# Patient Record
Sex: Female | Born: 1945 | Race: White | Hispanic: No | Marital: Married | Smoking: Never smoker
Health system: Southern US, Community
[De-identification: ages and names within clinical notes are randomized; demographics above are authoritative.]

## PROBLEM LIST (undated history)

## (undated) DIAGNOSIS — I471 Supraventricular tachycardia, unspecified: Secondary | ICD-10-CM

## (undated) DIAGNOSIS — Z789 Other specified health status: Secondary | ICD-10-CM

## (undated) DIAGNOSIS — E785 Hyperlipidemia, unspecified: Secondary | ICD-10-CM

## (undated) HISTORY — PX: COLONOSCOPY: SHX174

## (undated) HISTORY — DX: Supraventricular tachycardia, unspecified: I47.10

## (undated) HISTORY — DX: Hyperlipidemia, unspecified: E78.5

## (undated) HISTORY — DX: Supraventricular tachycardia: I47.1

---

## 1996-07-10 HISTORY — PX: BREAST SURGERY: SHX581

## 1998-06-08 ENCOUNTER — Other Ambulatory Visit: Admission: RE | Admit: 1998-06-08 | Discharge: 1998-06-08 | Payer: Self-pay | Admitting: Family Medicine

## 1999-06-10 HISTORY — PX: ANKLE SURGERY: SHX546

## 1999-06-10 HISTORY — PX: BONY PELVIS SURGERY: SHX572

## 1999-09-02 ENCOUNTER — Encounter: Payer: Self-pay | Admitting: Emergency Medicine

## 1999-09-02 ENCOUNTER — Emergency Department (HOSPITAL_COMMUNITY): Admission: EM | Admit: 1999-09-02 | Discharge: 1999-09-02 | Payer: Self-pay

## 1999-10-20 ENCOUNTER — Emergency Department (HOSPITAL_COMMUNITY): Admission: EM | Admit: 1999-10-20 | Discharge: 1999-10-20 | Payer: Self-pay | Admitting: Emergency Medicine

## 2001-04-29 ENCOUNTER — Ambulatory Visit (HOSPITAL_COMMUNITY): Admission: RE | Admit: 2001-04-29 | Discharge: 2001-04-29 | Payer: Self-pay | Admitting: *Deleted

## 2003-07-04 ENCOUNTER — Other Ambulatory Visit: Admission: RE | Admit: 2003-07-04 | Discharge: 2003-07-04 | Payer: Self-pay | Admitting: *Deleted

## 2003-09-29 ENCOUNTER — Ambulatory Visit (HOSPITAL_COMMUNITY): Admission: RE | Admit: 2003-09-29 | Discharge: 2003-09-29 | Payer: Self-pay | Admitting: Gastroenterology

## 2010-09-23 ENCOUNTER — Other Ambulatory Visit: Payer: Self-pay | Admitting: Family Medicine

## 2010-09-23 DIAGNOSIS — Z803 Family history of malignant neoplasm of breast: Secondary | ICD-10-CM

## 2010-09-23 DIAGNOSIS — N6019 Diffuse cystic mastopathy of unspecified breast: Secondary | ICD-10-CM

## 2010-10-02 ENCOUNTER — Ambulatory Visit
Admission: RE | Admit: 2010-10-02 | Discharge: 2010-10-02 | Disposition: A | Discharge status: Home / Self Care | Payer: Medicare Other | Source: Ambulatory Visit | Attending: Family Medicine | Admitting: Family Medicine

## 2010-10-02 DIAGNOSIS — Z803 Family history of malignant neoplasm of breast: Secondary | ICD-10-CM

## 2010-10-02 DIAGNOSIS — N6019 Diffuse cystic mastopathy of unspecified breast: Secondary | ICD-10-CM

## 2010-10-02 MED ORDER — GADOBENATE DIMEGLUMINE 529 MG/ML IV SOLN
13.0000 mL | Freq: Once | INTRAVENOUS | Status: AC | PRN
  Administered 2010-10-02: 13 mL via INTRAVENOUS

## 2011-12-24 ENCOUNTER — Other Ambulatory Visit: Payer: Self-pay | Admitting: Radiology

## 2012-01-22 ENCOUNTER — Ambulatory Visit (INDEPENDENT_AMBULATORY_CARE_PROVIDER_SITE_OTHER): Payer: Medicare Other | Admitting: Surgery

## 2012-01-22 ENCOUNTER — Encounter (INDEPENDENT_AMBULATORY_CARE_PROVIDER_SITE_OTHER): Payer: Self-pay | Admitting: Surgery

## 2012-01-22 VITALS — BP 126/80 | HR 79 | Temp 99.0°F | Ht 67.5 in | Wt 135.6 lb

## 2012-01-22 DIAGNOSIS — D249 Benign neoplasm of unspecified breast: Secondary | ICD-10-CM

## 2012-01-22 NOTE — Progress Notes (Signed)
Patient ID: Sheryl Mejia, female   DOB: 07/07/1945, 66 y.o.   MRN: [id number]  Chief Complaint  Patient presents with  . Pre-op Exam    eval Rt br     HPI Sheryl COLLAR is a 66 y.o. female.  Patient sent at the request of Dr. Yolanda Bonine  do to right breast mammographic abnormality. This was biopsied and showed a papilloma. She denies a history of breast pain, breast mass or nipple discharge. She has remote history of multiple right breast biopsies for fibrocystic change. HPI  Past Medical History  Diagnosis Date  . Hyperlipidemia   . Osteoporosis     Past Surgical History  Procedure Date  . Breast surgery   . Ankle surgery   . Bony pelvis surgery     Family History  Problem Relation Age of Onset  . Cancer Mother     breast  . Cancer Father     colon  . Cancer Maternal Aunt     breast  . Cancer Paternal Uncle     esophageal  . Cancer Paternal Grandfather     stomach    Social History History  Substance Use Topics  . Smoking status: Never Smoker   . Smokeless tobacco: Not on file  . Alcohol Use: No    No Known Allergies  Current Outpatient Prescriptions  Medication Sig Dispense Refill  . Calcium Citrate-Vitamin D (CALCIUM CITRATE + D PO) Take by mouth.      . cholecalciferol (VITAMIN D) 1000 UNITS tablet Take 1,000 Units by mouth daily.      Marland Kitchen co-enzyme Q-10 30 MG capsule Take 100 mg by mouth 2 (two) times daily.      . diphenhydrAMINE (BENADRYL) 25 mg capsule Take 25 mg by mouth every 6 (six) hours as needed.      . fish oil-omega-3 fatty acids 1000 MG capsule Take 2 g by mouth 2 (two) times daily.      . Flaxseed, Linseed, (FLAXSEED OIL PO) Take by mouth.      . Glucosamine-Chondroit-Vit C-Mn (GLUCOSAMINE 1500 COMPLEX PO) Take by mouth.      . ibandronate (BONIVA) 150 MG tablet Take 150 mg by mouth every 30 (thirty) days. Take in the morning with a full glass of water, on an empty stomach, and do not take anything else by mouth or lie down for the next 30 min.       . simvastatin (ZOCOR) 5 MG tablet Take 5 mg by mouth at bedtime.      . vitamin E (VITAMIN E) 400 UNIT capsule Take 400 Units by mouth daily.        Review of Systems Review of Systems  Constitutional: Negative for fever, chills and unexpected weight change.  HENT: Negative for hearing loss, congestion, sore throat, trouble swallowing and voice change.   Eyes: Negative for visual disturbance.  Respiratory: Negative for cough and wheezing.   Cardiovascular: Negative for chest pain, palpitations and leg swelling.  Gastrointestinal: Negative for nausea, vomiting, abdominal pain, diarrhea, constipation, blood in stool, abdominal distention and anal bleeding.  Genitourinary: Negative for hematuria, vaginal bleeding and difficulty urinating.  Musculoskeletal: Negative for arthralgias.  Skin: Negative for rash and wound.  Neurological: Negative for seizures, syncope and headaches.  Hematological: Negative for adenopathy. Does not bruise/bleed easily.  Psychiatric/Behavioral: Negative for confusion.    Blood pressure 126/80, pulse 79, temperature 99 F (37.2 C), temperature source Temporal, height 5' 7.5" (1.715 m), weight 135 lb 9.6 oz (61.508  kg), SpO2 98.00%.  Physical Exam Physical Exam  Constitutional: She is oriented to person, place, and time. She appears well-developed and well-nourished.  HENT:  Head: Normocephalic and atraumatic.  Eyes: EOM are normal. Pupils are equal, round, and reactive to light.  Neck: Normal range of motion. Neck supple.  Cardiovascular: Normal rate and regular rhythm.   Pulmonary/Chest: Effort normal and breath sounds normal.    Musculoskeletal: Normal range of motion.  Neurological: She is alert and oriented to person, place, and time.  Skin: Skin is warm and dry.  Psychiatric: She has a normal mood and affect. Her behavior is normal. Judgment and thought content normal.    Data Reviewed Mammogram and pathology  Papilloma right breast upper outer  quadrant.  Assessment    Papilloma right breast    Plan    Recommend right breast needle localized lumpectomy for excision of papilloma given small potential malignant possibility.The procedure has been discussed with the patient. Alternatives to surgery have been discussed with the patient.  Risks of surgery include bleeding,  Infection,  Seroma formation, death,  and the need for further surgery.   The patient understands and wishes to proceed.       Sheryl Moudy A. 01/22/2012, 3:42 PM

## 2012-01-22 NOTE — Patient Instructions (Signed)
Lumpectomy, Breast Conserving Surgery A lumpectomy is breast surgery that removes only part of the breast. Another name used may be partial mastectomy. The amount removed varies. Make sure you understand how much of your breast will be removed. Reasons for a lumpectomy:  Any solid breast mass.   Grouped significant nodularity that may be confused with a solitary breast mass.  Lumpectomy is the most common form of breast cancer surgery today. The surgeon removes the portion of your breast which contains the tumor (cancer). This is the lump. Some normal tissue around the lump is also removed to be sure that all the tumor has been removed.  If cancer cells are found in the margins where the breast tissue was removed, your surgeon will do more surgery to remove the remaining cancer tissue. This is called re-excision surgery. Radiation and/or chemotherapy treatments are often given following a lumpectomy to kill any cancer cells that could possibly remain.  REASONS YOU MAY NOT BE ABLE TO HAVE BREAST CONSERVING SURGERY:  The tumor is located in more than one place.   Your breast is small and the tumor is large so the breast would be disfigured.   The entire tumor removal is not successful with a lumpectomy.   You cannot commit to a full course of chemotherapy, radiation therapy or are pregnant and cannot have radiation.   You have previously had radiation to the breast to treat cancer.  HOW A LUMPECTOMY IS PERFORMED If overnight nursing is not required following a biopsy, a lumpectomy can be performed as a same-day surgery. This can be done in a hospital, clinic, or surgical center. The anesthesia used will depend on your surgeon. They will discuss this with you. A general anesthetic keeps you sleeping through the procedure. LET YOUR CAREGIVERS KNOW ABOUT THE FOLLOWING:  Allergies   Medications taken including herbs, eye drops, over the counter medications, and creams.   Use of steroids (by  mouth or creams)   Previous problems with anesthetics or Novocaine.   Possibility of pregnancy, if this applies   History of blood clots (thrombophlebitis)   History of bleeding or blood problems.   Previous surgery   Other health problems  BEFORE THE PROCEDURE You should be present one hour prior to your procedure unless directed otherwise.  AFTER THE PROCEDURE  After surgery, you will be taken to the recovery area where a nurse will watch and check your progress. Once you're awake, stable, and taking fluids well, barring other problems you will be allowed to go home.   Ice packs applied to your operative site may help with discomfort and keep the swelling down.   A small rubber drain may be placed in the breast for a couple of days to prevent a hematoma from developing in the breast.   A pressure dressing may be applied for 24 to 48 hours to prevent bleeding.   Keep the wound dry.   You may resume a normal diet and activities as directed. Avoid strenuous activities affecting the arm on the side of the biopsy site such as tennis, swimming, heavy lifting (more than 10 pounds) or pulling.   Bruising in the breast is normal following this procedure.   Wearing a bra - even to bed - may be more comfortable and also help keep the dressing on.   Change dressings as directed.   Only take over-the-counter or prescription medicines for pain, discomfort, or fever as directed by your caregiver.  Call for your results as   instructed by your surgeon. Remember it is your responsibility to get the results of your lumpectomy if your surgeon asked you to follow-up. Do not assume everything is fine if you have not heard from your caregiver. SEEK MEDICAL CARE IF:   There is increased bleeding (more than a small spot) from the wound.   You notice redness, swelling, or increasing pain in the wound.   Pus is coming from wound.   An unexplained oral temperature above 102 F (38.9 C) develops.     You notice a foul smell coming from the wound or dressing.  SEEK IMMEDIATE MEDICAL CARE IF:   You develop a rash.   You have difficulty breathing.   You have any allergic problems.  Document Released: 07/07/2006 Document Revised: 05/15/2011 Document Reviewed: 10/08/2006 [employer] Patient Information 2012 [employer]. 

## 2012-02-17 ENCOUNTER — Encounter (HOSPITAL_BASED_OUTPATIENT_CLINIC_OR_DEPARTMENT_OTHER): Payer: Self-pay | Admitting: *Deleted

## 2012-02-17 NOTE — Progress Notes (Signed)
To come in for [facility] labs and cxr- Pt goes by Orlie Pollen-

## 2012-02-18 ENCOUNTER — Encounter (HOSPITAL_BASED_OUTPATIENT_CLINIC_OR_DEPARTMENT_OTHER)
Admission: RE | Admit: 2012-02-18 | Discharge: 2012-02-18 | Disposition: A | Discharge status: Home / Self Care | Payer: Medicare Other | Source: Ambulatory Visit | Attending: Surgery | Admitting: Surgery

## 2012-02-18 ENCOUNTER — Ambulatory Visit
Admission: RE | Admit: 2012-02-18 | Discharge: 2012-02-18 | Disposition: A | Discharge status: Home / Self Care | Payer: Medicare Other | Source: Ambulatory Visit | Attending: Surgery | Admitting: Surgery

## 2012-02-18 LAB — CBC WITH DIFFERENTIAL/PLATELET
Basophils Relative: 0 % (ref 0–1)
Eosinophils Absolute: 0 10*3/uL (ref 0.0–0.7)
Eosinophils Relative: 1 % (ref 0–5)
Hemoglobin: 12.9 g/dL (ref 12.0–15.0)
Lymphs Abs: 1.5 10*3/uL (ref 0.7–4.0)
MCH: 31.9 pg (ref 26.0–34.0)
MCHC: 35 g/dL (ref 30.0–36.0)
MCV: 91.1 fL (ref 78.0–100.0)
Monocytes Relative: 12 % (ref 3–12)
Neutrophils Relative %: 53 % (ref 43–77)
RBC: 4.05 MIL/uL (ref 3.87–5.11)

## 2012-02-18 LAB — COMPREHENSIVE METABOLIC PANEL
Albumin: 4.2 g/dL (ref 3.5–5.2)
Alkaline Phosphatase: 69 U/L (ref 39–117)
BUN: 20 mg/dL (ref 6–23)
Calcium: 10.2 mg/dL (ref 8.4–10.5)
Creatinine, Ser: 0.78 mg/dL (ref 0.50–1.10)
GFR calc Af Amer: >90 mL/min (ref >90)
Glucose, Bld: 89 mg/dL (ref 70–99)
Potassium: 4.5 mEq/L (ref 3.5–5.1)
Total Protein: 7.2 g/dL (ref 6.0–8.3)

## 2012-02-23 ENCOUNTER — Encounter (HOSPITAL_BASED_OUTPATIENT_CLINIC_OR_DEPARTMENT_OTHER): Payer: Self-pay | Admitting: Anesthesiology

## 2012-02-23 ENCOUNTER — Encounter (HOSPITAL_BASED_OUTPATIENT_CLINIC_OR_DEPARTMENT_OTHER)
Admission: RE | Disposition: A | Discharge status: Home / Self Care | Payer: Self-pay | Source: Ambulatory Visit | Attending: Surgery

## 2012-02-23 ENCOUNTER — Encounter (HOSPITAL_BASED_OUTPATIENT_CLINIC_OR_DEPARTMENT_OTHER): Payer: Self-pay | Admitting: Surgery

## 2012-02-23 ENCOUNTER — Ambulatory Visit (HOSPITAL_BASED_OUTPATIENT_CLINIC_OR_DEPARTMENT_OTHER)
Admission: RE | Admit: 2012-02-23 | Discharge: 2012-02-23 | Disposition: A | Discharge status: Home / Self Care | Payer: Medicare Other | Source: Ambulatory Visit | Attending: Surgery | Admitting: Surgery

## 2012-02-23 ENCOUNTER — Ambulatory Visit (HOSPITAL_BASED_OUTPATIENT_CLINIC_OR_DEPARTMENT_OTHER): Payer: Medicare Other | Admitting: Anesthesiology

## 2012-02-23 DIAGNOSIS — D249 Benign neoplasm of unspecified breast: Secondary | ICD-10-CM

## 2012-02-23 DIAGNOSIS — N6019 Diffuse cystic mastopathy of unspecified breast: Secondary | ICD-10-CM

## 2012-02-23 HISTORY — DX: Other specified health status: Z78.9

## 2012-02-23 SURGERY — BREAST LUMPECTOMY WITH NEEDLE LOCALIZATION
Anesthesia: General | Site: Breast | Laterality: Right | Wound class: Clean

## 2012-02-23 MED ORDER — DEXAMETHASONE SODIUM PHOSPHATE 4 MG/ML IJ SOLN
INTRAMUSCULAR | Status: DC | PRN
  Administered 2012-02-23: 10 mg via INTRAVENOUS

## 2012-02-23 MED ORDER — DEXTROSE 5 % IV SOLN
3.0000 g | INTRAVENOUS | Status: AC
  Administered 2012-02-23: 3 g via INTRAVENOUS

## 2012-02-23 MED ORDER — LACTATED RINGERS IV SOLN
INTRAVENOUS | Status: DC
  Administered 2012-02-23: 13:00:00 via INTRAVENOUS

## 2012-02-23 MED ORDER — PROPOFOL 10 MG/ML IV BOLUS
INTRAVENOUS | Status: DC | PRN
  Administered 2012-02-23: 200 mg via INTRAVENOUS

## 2012-02-23 MED ORDER — OXYCODONE-ACETAMINOPHEN 5-325 MG PO TABS: 1.0000 | ORAL_TABLET | ORAL | Status: DC | PRN

## 2012-02-23 MED ORDER — CHLORHEXIDINE GLUCONATE 4 % EX LIQD: 1.0000 "application " | Freq: Once | CUTANEOUS | Status: DC

## 2012-02-23 MED ORDER — MEPERIDINE HCL 25 MG/ML IJ SOLN: 6.2500 mg | INTRAMUSCULAR | Status: DC | PRN

## 2012-02-23 MED ORDER — MIDAZOLAM HCL 5 MG/5ML IJ SOLN
INTRAMUSCULAR | Status: DC | PRN
  Administered 2012-02-23: 2 mg via INTRAVENOUS

## 2012-02-23 MED ORDER — HYDROMORPHONE HCL PF 1 MG/ML IJ SOLN: 0.2500 mg | INTRAMUSCULAR | Status: DC | PRN

## 2012-02-23 MED ORDER — PROMETHAZINE HCL 25 MG/ML IJ SOLN: 6.2500 mg | INTRAMUSCULAR | Status: DC | PRN

## 2012-02-23 MED ORDER — ONDANSETRON HCL 4 MG/2ML IJ SOLN
INTRAMUSCULAR | Status: DC | PRN
  Administered 2012-02-23: 4 mg via INTRAVENOUS

## 2012-02-23 MED ORDER — BUPIVACAINE-EPINEPHRINE 0.25% -1:200000 IJ SOLN
INTRAMUSCULAR | Status: DC | PRN
  Administered 2012-02-23: 10 mL

## 2012-02-23 MED ORDER — FENTANYL CITRATE 0.05 MG/ML IJ SOLN
INTRAMUSCULAR | Status: DC | PRN
  Administered 2012-02-23 (×2): 50 ug via INTRAVENOUS

## 2012-02-23 MED ORDER — OXYCODONE HCL 5 MG PO TABS: 5.0000 mg | ORAL_TABLET | Freq: Once | ORAL | Status: DC | PRN

## 2012-02-23 MED ORDER — LIDOCAINE HCL (CARDIAC) 20 MG/ML IV SOLN
INTRAVENOUS | Status: DC | PRN
  Administered 2012-02-23: 50 mg via INTRAVENOUS

## 2012-02-23 MED ORDER — OXYCODONE HCL 5 MG/5ML PO SOLN: 5.0000 mg | Freq: Once | ORAL | Status: DC | PRN

## 2012-02-23 SURGICAL SUPPLY — 42 items
ADH SKN CLS APL DERMABOND .7 (GAUZE/BANDAGES/DRESSINGS) ×1
BLADE SURG 15 STRL LF DISP TIS (BLADE) ×1 IMPLANT
BLADE SURG 15 STRL SS (BLADE) ×2
CANISTER SUCTION 1200CC (MISCELLANEOUS) ×2 IMPLANT
CHLORAPREP W/TINT 26ML (MISCELLANEOUS) ×2 IMPLANT
CLIP TI WIDE RED SMALL 6 (CLIP) IMPLANT
CLOTH BEACON ORANGE TIMEOUT ST (SAFETY) ×2 IMPLANT
COVER MAYO STAND STRL (DRAPES) ×2 IMPLANT
COVER TABLE BACK 60X90 (DRAPES) ×2 IMPLANT
DECANTER SPIKE VIAL GLASS SM (MISCELLANEOUS) ×1 IMPLANT
DERMABOND ADVANCED (GAUZE/BANDAGES/DRESSINGS) ×1
DERMABOND ADVANCED .7 DNX12 (GAUZE/BANDAGES/DRESSINGS) ×1 IMPLANT
DEVICE DUBIN W/COMP PLATE 8390 (MISCELLANEOUS) ×1 IMPLANT
DRAPE LAPAROSCOPIC ABDOMINAL (DRAPES) IMPLANT
DRAPE PED LAPAROTOMY (DRAPES) ×2 IMPLANT
DRAPE UTILITY XL STRL (DRAPES) ×2 IMPLANT
ELECT COATED BLADE 2.86 ST (ELECTRODE) ×2 IMPLANT
ELECT REM PT RETURN 9FT ADLT (ELECTROSURGICAL) ×2
ELECTRODE REM PT RTRN 9FT ADLT (ELECTROSURGICAL) ×1 IMPLANT
GLOVE BIOGEL PI IND STRL 8 (GLOVE) ×1 IMPLANT
GLOVE BIOGEL PI INDICATOR 8 (GLOVE) ×1
GLOVE ECLIPSE 6.5 STRL STRAW (GLOVE) ×1 IMPLANT
GLOVE ECLIPSE 8.0 STRL XLNG CF (GLOVE) ×2 IMPLANT
GOWN PREVENTION PLUS XLARGE (GOWN DISPOSABLE) ×3 IMPLANT
KIT MARKER MARGIN INK (KITS) IMPLANT
NDL HYPO 25X1 1.5 SAFETY (NEEDLE) ×1 IMPLANT
NEEDLE HYPO 25X1 1.5 SAFETY (NEEDLE) ×2 IMPLANT
NS IRRIG 1000ML POUR BTL (IV SOLUTION) ×2 IMPLANT
PACK BASIN DAY SURGERY FS (CUSTOM PROCEDURE TRAY) ×2 IMPLANT
PENCIL BUTTON HOLSTER BLD 10FT (ELECTRODE) ×2 IMPLANT
SLEEVE SCD COMPRESS KNEE MED (MISCELLANEOUS) ×2 IMPLANT
SPONGE LAP 4X18 X RAY DECT (DISPOSABLE) ×2 IMPLANT
SUT MON AB 4-0 PC3 18 (SUTURE) ×2 IMPLANT
SUT SILK 2 0 SH (SUTURE) IMPLANT
SUT VIC AB 3-0 SH 27 (SUTURE) ×2
SUT VIC AB 3-0 SH 27X BRD (SUTURE) ×1 IMPLANT
SYR CONTROL 10ML LL (SYRINGE) ×2 IMPLANT
TOWEL OR 17X24 6PK STRL BLUE (TOWEL DISPOSABLE) ×3 IMPLANT
TOWEL OR NON WOVEN STRL DISP B (DISPOSABLE) ×2 IMPLANT
TUBE CONNECTING 20X1/4 (TUBING) ×2 IMPLANT
WATER STERILE IRR 1000ML POUR (IV SOLUTION) IMPLANT
YANKAUER SUCT BULB TIP NO VENT (SUCTIONS) ×2 IMPLANT

## 2012-02-23 NOTE — Transfer of Care (Signed)
Immediate Anesthesia Transfer of Care Note  Patient: Sheryl Mejia  Procedure(s) Performed: Procedure(s) (LRB) with comments: BREAST LUMPECTOMY WITH NEEDLE LOCALIZATION (Right) - right breast needle localized lumpectomy  Patient Location: PACU  Anesthesia Type: General  Level of Consciousness: awake, alert  and oriented  Airway & Oxygen Therapy: Patient Spontanous Breathing and Patient connected to face mask oxygen  Post-op Assessment: Report given to PACU RN and Post -op Vital signs reviewed and stable  Post vital signs: Reviewed and stable  Complications: No apparent anesthesia complications

## 2012-02-23 NOTE — Anesthesia Postprocedure Evaluation (Signed)
  Anesthesia Post-op Note  Patient: Sheryl Mejia  Procedure(s) Performed: Procedure(s) (LRB) with comments: BREAST LUMPECTOMY WITH NEEDLE LOCALIZATION (Right) - right breast needle localized lumpectomy  Patient Location: PACU  Anesthesia Type: General  Level of Consciousness: awake  Airway and Oxygen Therapy: Patient Spontanous Breathing  Post-op Pain: mild  Post-op Assessment: Post-op Vital signs reviewed  Post-op Vital Signs: stable  Complications: No apparent anesthesia complications

## 2012-02-23 NOTE — Anesthesia Preprocedure Evaluation (Signed)
Anesthesia Evaluation  Patient identified by MRN, date of birth, ID band Patient awake    Reviewed: Allergy & Precautions, H&P , NPO status , Patient's Chart, lab work & pertinent test results  History of Anesthesia Complications Negative for: history of anesthetic complications  Airway Mallampati: I  Neck ROM: full    Dental No notable dental hx. (+) Teeth Intact   Pulmonary neg pulmonary ROS,  breath sounds clear to auscultation  Pulmonary exam normal       Cardiovascular negative cardio ROS  IRhythm:regular Rate:Normal     Neuro/Psych negative neurological ROS  negative psych ROS   GI/Hepatic negative GI ROS, Neg liver ROS,   Endo/Other  negative endocrine ROS  Renal/GU negative Renal ROS  negative genitourinary   Musculoskeletal   Abdominal   Peds  Hematology negative hematology ROS (+)   Anesthesia Other Findings   Reproductive/Obstetrics negative OB ROS                           Anesthesia Physical Anesthesia Plan  ASA: I  Anesthesia Plan: General and General LMA   Post-op Pain Management:    Induction:   Airway Management Planned:   Additional Equipment:   Intra-op Plan:   Post-operative Plan:   Informed Consent: I have reviewed the patients History and Physical, chart, labs and discussed the procedure including the risks, benefits and alternatives for the proposed anesthesia with the patient or authorized representative who has indicated his/her understanding and acceptance.     Plan Discussed with: CRNA and Surgeon  Anesthesia Plan Comments:         Anesthesia Quick Evaluation  

## 2012-02-23 NOTE — Op Note (Signed)
Right Breast Partial Mastectomy with needle localization  Indications: This patient presents with history of a right breast mass. Given the clinical history and physical exam, along with indicated diagnostic studies, breast biopsy will be performed.Core biopsy showed papilloma.The procedure has been discussed with the patient. Alternatives to surgery have been discussed with the patient.  Risks of surgery include bleeding,  Infection,  Seroma formation, death,  and the need for further surgery.   The patient understands and wishes to proceed.  Pre-operative Diagnosis: right breast mass/papilloma  Post-operative Diagnosis: right breast mass/papilloma  Surgeon: Harriette Bouillon A.   Assistants: OR staff  Anesthesia: General LMA anesthesia and Local anesthesia 0.25.% bupivacaine, with epinephrine  ASA Class: 2  Procedure Details  The patient was seen in the Holding Room. The risks, benefits, complications, treatment options, and expected outcomes were discussed with the patient. The possibilities of reaction to medication, pulmonary aspiration, bleeding, infection, the need for additional procedures, failure to diagnose a condition, and creating a complication requiring transfusion or operation were discussed with the patient. The patient concurred with the proposed plan, giving informed consent. The site of surgery properly noted/marked. The patient was taken to Operating Room, identified as Sheryl Mejia, and the procedure verified as lumpectomy. A Time Out was held and the above information confirmed.  After induction of anesthesia, the right breast and chest were prepped and draped in standard fashion. The lumpectomy was performed by creating an oblique incision over the axillary tail of the breast adjacent to wire.  All tissue was excised around  The mass was excised. Hemostasis was achieved with cautery. The wound was irrigated and closed with a 3-0 Vicryl and 4-O monocryl.  subcuticular closure  in layers.  Specimen oriented with suture.    Sterile dressings were applied. At the end of the operation, all sponge, instrument, and needle counts were correct.  Findings: grossly clear surgical margins  Estimated Blood Loss:  Minimal         Drains: none         Total IV Fluids: 1200 mL         Specimens: breast mass  right           Complications:  None; patient tolerated the procedure well.         Disposition: PACU - hemodynamically stable.         Condition: Stable

## 2012-02-23 NOTE — H&P (Signed)
Sheryl Mejia   MRN: [id number]   Description: 66 year old female  Provider: Dortha Schwalbe., MD  Department: [facility]        Diagnoses     Intraductal papilloma of breast   - Primary    217    right      Reason for Visit     Pre-op Exam    eval Rt br         Vitals - Last Recorded       BP Pulse Temp Ht Wt BMI    126/80 79 99 F (37.2 C) (Temporal) 5' 7.5" (1.715 m) 135 lb 9.6 oz (61.508 kg) 20.92 kg/m2         SpO2              98%                 Progress Notes   Patient ID: Sheryl Mejia, female   DOB: Jan 10, 1946, 66 y.o.   MRN: [id number]    Chief Complaint   Patient presents with   .  Pre-op Exam       eval Rt br       HPI Sheryl Mejia is a 67 y.o. female.  Patient sent at the request of Dr. Yolanda Bonine  do to right breast mammographic abnormality. This was biopsied and showed a papilloma. She denies a history of breast pain, breast mass or nipple discharge. She has remote history of multiple right breast biopsies for fibrocystic change. HPI    Past Medical History   Diagnosis  Date   .  Hyperlipidemia     .  Osteoporosis         Past Surgical History   Procedure  Date   .  Breast surgery     .  Ankle surgery     .  Bony pelvis surgery         Family History   Problem  Relation  Age of Onset   .  Cancer  Mother         breast   .  Cancer  Father         colon   .  Cancer  Maternal Aunt         breast   .  Cancer  Paternal Uncle         esophageal   .  Cancer  Paternal Grandfather         stomach      Social History History   Substance Use Topics   .  Smoking status:  Never Smoker    .  Smokeless tobacco:  Not on file   .  Alcohol Use:  No      No Known Allergies    Current Outpatient Prescriptions   Medication  Sig  Dispense  Refill   .  Calcium Citrate-Vitamin D (CALCIUM CITRATE + D PO)  Take by mouth.         .  cholecalciferol (VITAMIN D) 1000 UNITS tablet  Take 1,000 Units by mouth daily.         Marland Kitchen  co-enzyme  Q-10 30 MG capsule  Take 100 mg by mouth 2 (two) times daily.         .  diphenhydrAMINE (BENADRYL) 25 mg capsule  Take 25 mg by mouth every 6 (six) hours as needed.         .  fish oil-omega-3 fatty acids 1000  MG capsule  Take 2 g by mouth 2 (two) times daily.         .  Flaxseed, Linseed, (FLAXSEED OIL PO)  Take by mouth.         .  Glucosamine-Chondroit-Vit C-Mn (GLUCOSAMINE 1500 COMPLEX PO)  Take by mouth.         .  ibandronate (BONIVA) 150 MG tablet  Take 150 mg by mouth every 30 (thirty) days. Take in the morning with a full glass of water, on an empty stomach, and do not take anything else by mouth or lie down for the next 30 min.         .  simvastatin (ZOCOR) 5 MG tablet  Take 5 mg by mouth at bedtime.         .  vitamin E (VITAMIN E) 400 UNIT capsule  Take 400 Units by mouth daily.            Review of Systems Review of Systems  Constitutional: Negative for fever, chills and unexpected weight change.  HENT: Negative for hearing loss, congestion, sore throat, trouble swallowing and voice change.   Eyes: Negative for visual disturbance.  Respiratory: Negative for cough and wheezing.   Cardiovascular: Negative for chest pain, palpitations and leg swelling.  Gastrointestinal: Negative for nausea, vomiting, abdominal pain, diarrhea, constipation, blood in stool, abdominal distention and anal bleeding.  Genitourinary: Negative for hematuria, vaginal bleeding and difficulty urinating.  Musculoskeletal: Negative for arthralgias.  Skin: Negative for rash and wound.  Neurological: Negative for seizures, syncope and headaches.  Hematological: Negative for adenopathy. Does not bruise/bleed easily.  Psychiatric/Behavioral: Negative for confusion.    Blood pressure 126/80, pulse 79, temperature 99 F (37.2 C), temperature source Temporal, height 5' 7.5" (1.715 m), weight 135 lb 9.6 oz (61.508 kg), SpO2 98.00%.   Physical Exam Physical Exam  Constitutional: She is oriented to person,  place, and time. She appears well-developed and well-nourished.  HENT:   Head: Normocephalic and atraumatic.  Eyes: EOM are normal. Pupils are equal, round, and reactive to light.  Neck: Normal range of motion. Neck supple.  Cardiovascular: Normal rate and regular rhythm.   Pulmonary/Chest: Effort normal and breath sounds normal.    Musculoskeletal: Normal range of motion.  Neurological: She is alert and oriented to person, place, and time.  Skin: Skin is warm and dry.  Psychiatric: She has a normal mood and affect. Her behavior is normal. Judgment and thought content normal.    Data Reviewed Mammogram and pathology  Papilloma right breast upper outer quadrant.   Assessment Papilloma right breast   Plan Recommend right breast needle localized lumpectomy for excision of papilloma given small potential malignant possibility.The procedure has been discussed with the patient. Alternatives to surgery have been discussed with the patient.  Risks of surgery include bleeding,  Infection,  Seroma formation, death,  and the need for further surgery.   The patient understands and wishes to proceed.       Jamelle Goldston A. 02/23/2012

## 2012-02-23 NOTE — Interval H&P Note (Signed)
History and Physical Interval Note:  02/23/2012 12:36 PM  Sheryl Mejia  has presented today for surgery, with the diagnosis of right breast papilloma  The various methods of treatment have been discussed with the patient and family. After consideration of risks, benefits and other options for treatment, the patient has consented to  Procedure(s) (LRB) with comments: BREAST LUMPECTOMY WITH NEEDLE LOCALIZATION (Right) - right breast needle localized lumpectomy as a surgical intervention .  The patient's history has been reviewed, patient examined, no change in status, stable for surgery.  I have reviewed the patient's chart and labs.  Questions were answered to the patient's satisfaction.     Rynell Ciotti A.

## 2012-03-01 ENCOUNTER — Telehealth (INDEPENDENT_AMBULATORY_CARE_PROVIDER_SITE_OTHER): Payer: Self-pay

## 2012-03-01 NOTE — Telephone Encounter (Signed)
I have called patient several times today and the phone has been busy all day, I tried calling cell and no answer and no voicemail available. Please give her path results if she calls back. Path was benign, papilloma.

## 2012-03-03 ENCOUNTER — Encounter (INDEPENDENT_AMBULATORY_CARE_PROVIDER_SITE_OTHER): Payer: Self-pay

## 2012-03-05 ENCOUNTER — Encounter (INDEPENDENT_AMBULATORY_CARE_PROVIDER_SITE_OTHER): Payer: Self-pay | Admitting: Surgery

## 2012-03-05 ENCOUNTER — Ambulatory Visit (INDEPENDENT_AMBULATORY_CARE_PROVIDER_SITE_OTHER): Payer: Medicare Other | Admitting: Surgery

## 2012-03-05 VITALS — BP 128/76 | HR 81 | Temp 98.6°F | Resp 18 | Ht 67.0 in | Wt 135.4 lb

## 2012-03-05 DIAGNOSIS — Z9889 Other specified postprocedural states: Secondary | ICD-10-CM

## 2012-03-05 NOTE — Progress Notes (Signed)
Patient returns after right breast needle localized lumpectomy. She is doing well.  Exam: Right breast incision clean dry and intact without signs of infection  Impression: Status post right breast needle was lumpectomy for fibrocystic change and papilloma SCLEROSING INTRADUCTAL PAPILLOMA WITH ADENOSIS  Plan: Return as needed. Resume regular activity. Resume screening mammogram next year

## 2012-03-05 NOTE — Patient Instructions (Signed)
Return as needed

## 2012-03-08 ENCOUNTER — Telehealth (INDEPENDENT_AMBULATORY_CARE_PROVIDER_SITE_OTHER): Payer: Self-pay

## 2012-03-08 NOTE — Telephone Encounter (Signed)
LM with pt's husband her appt was rescheduled to 04/02/12 at 9:45am.

## 2012-03-08 NOTE — Telephone Encounter (Signed)
Telephone message sent to incorrect patient.  Disregard.

## 2012-04-02 ENCOUNTER — Ambulatory Visit (INDEPENDENT_AMBULATORY_CARE_PROVIDER_SITE_OTHER): Payer: Medicare Other | Admitting: General Surgery

## 2020-11-27 ENCOUNTER — Ambulatory Visit: Payer: Medicare Other | Admitting: Cardiology

## 2020-11-27 ENCOUNTER — Encounter: Payer: Self-pay | Admitting: Cardiology

## 2020-11-27 ENCOUNTER — Other Ambulatory Visit: Payer: Self-pay

## 2020-11-27 VITALS — BP 170/96 | HR 96 | Temp 98.6°F | Resp 17 | Ht 67.0 in | Wt 132.6 lb

## 2020-11-27 DIAGNOSIS — R9431 Abnormal electrocardiogram [ECG] [EKG]: Secondary | ICD-10-CM

## 2020-11-27 DIAGNOSIS — R002 Palpitations: Secondary | ICD-10-CM

## 2020-11-27 NOTE — Progress Notes (Signed)
Date:  11/27/2020   ID:  [name], DOB [birth year], MRN [id number]  PCP:  Herbert Pun., MD (Inactive)  Cardiologist:  Rex Kras, DO, [facility] (established care 11/27/2020)  REASON FOR CONSULT: Palpitations  REQUESTING PHYSICIAN:  Joya Gaskins, [address] [place],  [place] [address]  Chief Complaint  Patient presents with   New Patient (Initial Visit)   Palpitations    Ref by Patrecia Pace    HPI  Sheryl Mejia is a 75 y.o. female who presents to the office with a chief complaint of " palpitations." Patient's past medical history and cardiovascular risk factors include: History of hyperlipidemia currently not on statin therapy, anxiety, postmenopausal female.  She is referred to the office at the request of Joya Gaskins, * for evaluation of palpitations.  Patient presents to the office for evaluation of palpitations.  She recently had her fourth COVID vaccination on Oct 08, 2020.  Thereafter she has been experiencing palpitations which she describes as her heart rate "beating harder."  No improving or worsening factors.  The symptoms last for seconds but no more than couple minutes, and usually self-limited.  Patient denies any near-syncope or syncopal events.  Since following up with her PCP patient states that her symptoms of palpitation are improving but not resolved.  Her blood pressure at today's office visit is elevated.  Patient states that this is most likely secondary to being nervous/anxiousness.  But her home blood pressures are usually between 120-130 mmHg.  I also reviewed her blood pressures at her PCPs office which were also elevated.  Patient denies any chest pain or shortness of breath at rest or with effort related activities.  FUNCTIONAL STATUS: No structured exercise program or daily routine.    ALLERGIES: No Known Allergies  MEDICATION LIST PRIOR TO VISIT: Current Meds  Medication Sig   Calcium Citrate-Vitamin D (CALCIUM  CITRATE + D PO) Take by mouth.   fish oil-omega-3 fatty acids 1000 MG capsule Take 2 g by mouth 2 (two) times daily.   Flaxseed, Linseed, (FLAXSEED OIL PO) Take by mouth.   Glucosamine-Chondroit-Vit C-Mn (GLUCOSAMINE 1500 COMPLEX PO) Take by mouth.   vitamin E 180 MG (400 UNITS) capsule Take 400 Units by mouth daily.     PAST MEDICAL HISTORY: Past Medical History:  Diagnosis Date   Hyperlipidemia    No pertinent past medical history    Osteoporosis     PAST SURGICAL HISTORY: Past Surgical History:  Procedure Laterality Date   ANKLE SURGERY  2001   lt ankle-orif   BONY PELVIS SURGERY  2001   lt hip orif   BREAST SURGERY  02,98   x2-rt br bx   COLONOSCOPY      FAMILY HISTORY: The patient family history includes Cancer in her father, maternal aunt, mother, paternal grandfather, and paternal uncle.  SOCIAL HISTORY:  The patient  reports that she has never smoked. She has never used smokeless tobacco. She reports that she does not drink alcohol and does not use drugs.  REVIEW OF SYSTEMS: Review of Systems  Constitutional: Negative for chills and fever.  HENT:  Negative for hoarse voice and nosebleeds.   Eyes:  Negative for discharge, double vision and pain.  Cardiovascular:  Positive for palpitations. Negative for chest pain, claudication, dyspnea on exertion, leg swelling, near-syncope, orthopnea, paroxysmal nocturnal dyspnea and syncope.  Respiratory:  Negative for hemoptysis and shortness of breath.   Musculoskeletal:  Negative for muscle cramps and myalgias.  Gastrointestinal:  Negative for abdominal pain, constipation, diarrhea, hematemesis, hematochezia, melena, nausea and vomiting.  Neurological:  Negative for dizziness and light-headedness.   PHYSICAL EXAM: Vitals with BMI 11/27/2020 11/27/2020 03/05/2012  Height - 5\' 7"  5\' 7"   Weight - 132 lbs 10 oz 135 lbs 6 oz  BMI - 10.27 25.3  Systolic 664 403 474  Diastolic 96 95 76  Pulse 96 92 81   CONSTITUTIONAL:  Well-developed and well-nourished. No acute distress.  SKIN: Skin is warm and dry. No rash noted. No cyanosis. No pallor. No jaundice HEAD: Normocephalic and atraumatic.  EYES: No scleral icterus MOUTH/THROAT: Moist oral membranes.  NECK: No JVD present. No thyromegaly noted. No carotid bruits  LYMPHATIC: No visible cervical adenopathy.  CHEST Normal respiratory effort. No intercostal retractions  LUNGS: Clear to auscultation bilaterally.  No stridor. No wheezes. No rales.  CARDIOVASCULAR: Tachycardia, regular, no murmurs rubs or gallops appreciated secondary to tachycardia. ABDOMINAL: Nonobese, soft, nontender, nondistended, positive bowel sounds in all 4 quadrants no apparent ascites.  EXTREMITIES: No peripheral edema.  Surgical scars over the left heel are well-healed. HEMATOLOGIC: No significant bruising NEUROLOGIC: Oriented to person, place, and time. Nonfocal. Normal muscle tone.  PSYCHIATRIC: Normal mood and affect. Normal behavior. Cooperative  CARDIAC DATABASE: EKG: 11/27/2020: Sinus  Tachycardia, 108bpm, consider old anteroseptal infarct, ST depression inferolateral, without underlying injury pattern.  Echocardiogram: No results found for this or any previous visit from the past 1095 days.    Stress Testing: No results found for this or any previous visit from the past 1095 days.   Heart Catheterization: None   LABORATORY DATA: CBC Latest Ref Rng & Units 02/23/2012 02/18/2012  WBC 4.0 - 10.5 K/uL - 4.5  Hemoglobin 12.0 - 15.0 g/dL 14.6 12.9  Hematocrit 36.0 - 46.0 % - 36.9  Platelets 150 - 400 K/uL - 217    CMP Latest Ref Rng & Units 02/18/2012  Glucose 70 - 99 mg/dL 89  BUN 6 - 23 mg/dL 20  Creatinine 0.50 - 1.10 mg/dL 0.78  Sodium 135 - 145 mEq/L 139  Potassium 3.5 - 5.1 mEq/L 4.5  Chloride 96 - 112 mEq/L 102  CO2 19 - 32 mEq/L 29  Calcium 8.4 - 10.5 mg/dL 10.2  Total Protein 6.0 - 8.3 g/dL 7.2  Total Bilirubin 0.3 - 1.2 mg/dL 0.5  Alkaline Phos 39 - 117 U/L  69  AST 0 - 37 U/L 25  ALT 0 - 35 U/L 23    Lipid Panel  No results found for: CHOL, TRIG, HDL, CHOLHDL, VLDL, LDLCALC, LDLDIRECT, LABVLDL  No components found for: NTPROBNP No results for input(s): PROBNP in the last 8760 hours. No results for input(s): TSH in the last 8760 hours.  BMP No results for input(s): NA, K, CL, CO2, GLUCOSE, BUN, CREATININE, CALCIUM, GFRNONAA, GFRAA in the last 8760 hours.  HEMOGLOBIN A1C No results found for: HGBA1C, MPG  External Labs: Collected: 10/31/2020. Hemoglobin 13.3 g/dL, hematocrit 38.6% TSH 2.75. Sodium 139, potassium 4.8, chloride 102, bicarb 31, BUN 17, creatinine 0.71  IMPRESSION:    ICD-10-CM   1. Palpitations  R00.2 EKG 12-Lead    LONG TERM MONITOR (3-14 DAYS)    2. Abnormal electrocardiogram  R94.31 PCV ECHOCARDIOGRAM COMPLETE    PCV MYOCARDIAL PERFUSION WO LEXISCAN       RECOMMENDATIONS: Sheryl Mejia is a 75 y.o. female whose past medical history and cardiac risk factors include: History of hyperlipidemia currently not on statin therapy, anxiety, postmenopausal female.  Palpitations: No known  reversible causes. Outside labs independently reviewed. TSH within normal limits. EKG shows sinus tachycardia with ST-T changes in the inferior and lateral leads which could be secondary to rate related changes but ischemia cannot be ruled out. Recommend 14-day extended Holter monitor to evaluate for dysrhythmias.  Abnormal EKG: Patient is noted to have ST-T changes in the inferolateral leads which may be secondary to elevated heart rate versus ischemia. She does not have any ischemic symptoms on presentation. She does have history of hyperlipidemia which was not treated. Echocardiogram will be ordered to evaluate for structural heart disease and left ventricular systolic function. Plan exercise nuclear stress test to evaluate for functional status and reversible ischemia I have asked her to follow-up with her PCP and have a fasting  lipid profile checked.  Patient's blood pressures are elevated at today's office visit I suspect that she may have a degree of benign essential hypertension.  But patient states that when she is at home her systolic blood pressures are usually between 120-130 mmHg.  I have asked her to keep a log of her blood pressures and to follow-up with either our office or PCP if her SBP is greater than or equal to 140 mmHg consistently.  Low-salt diet recommended.  FINAL MEDICATION LIST END OF ENCOUNTER: No orders of the defined types were placed in this encounter.   Medications Discontinued During This Encounter  Medication Reason   cholecalciferol (VITAMIN D) 1000 UNITS tablet Error   co-enzyme Q-10 30 MG capsule Error   ibandronate (BONIVA) 150 MG tablet Error   oxyCODONE-acetaminophen (ROXICET) 5-325 MG per tablet Error   simvastatin (ZOCOR) 5 MG tablet Error     Current Outpatient Medications:    Calcium Citrate-Vitamin D (CALCIUM CITRATE + D PO), Take by mouth., Disp: , Rfl:    fish oil-omega-3 fatty acids 1000 MG capsule, Take 2 g by mouth 2 (two) times daily., Disp: , Rfl:    Flaxseed, Linseed, (FLAXSEED OIL PO), Take by mouth., Disp: , Rfl:    Glucosamine-Chondroit-Vit C-Mn (GLUCOSAMINE 1500 COMPLEX PO), Take by mouth., Disp: , Rfl:    vitamin E 180 MG (400 UNITS) capsule, Take 400 Units by mouth daily., Disp: , Rfl:   Orders Placed This Encounter  Procedures   LONG TERM MONITOR (3-14 DAYS)   PCV MYOCARDIAL PERFUSION WO LEXISCAN   EKG 12-Lead   PCV ECHOCARDIOGRAM COMPLETE    There are no Patient Instructions on file for this visit.   --Continue cardiac medications as reconciled in final medication list. --Return in about 5 weeks (around 01/01/2021) for Follow up, Palpitations, Review test results. Or sooner if needed. --Continue follow-up with your primary care physician regarding the management of your other chronic comorbid conditions.  Patient's questions and concerns were  addressed to her satisfaction. She voices understanding of the instructions provided during this encounter.   This note was created using a voice recognition software as a result there may be grammatical errors inadvertently enclosed that do not reflect the nature of this encounter. Every attempt is made to correct such errors.  Total encounter time 45 minutes. *Total Encounter Time as defined by the Centers for Medicare and Medicaid Services includes, in addition to the face-to-face time of a patient visit (documented in the note above) non-face-to-face time: obtaining and reviewing outside history, ordering and reviewing medications, tests or procedures, care coordination (communications with other health care professionals or caregivers) and documentation in the medical record.   Rex Kras, [place], [facility]  Pager: [phone] Office:  [phone]    

## 2020-12-04 ENCOUNTER — Other Ambulatory Visit: Payer: Medicare Other

## 2020-12-17 ENCOUNTER — Ambulatory Visit: Payer: Medicare Other

## 2020-12-17 ENCOUNTER — Inpatient Hospital Stay: Payer: Medicare Other

## 2020-12-17 ENCOUNTER — Other Ambulatory Visit: Payer: Self-pay

## 2020-12-17 DIAGNOSIS — R9431 Abnormal electrocardiogram [ECG] [EKG]: Secondary | ICD-10-CM

## 2020-12-17 DIAGNOSIS — R002 Palpitations: Secondary | ICD-10-CM

## 2020-12-18 ENCOUNTER — Ambulatory Visit: Payer: Medicare Other

## 2020-12-18 ENCOUNTER — Inpatient Hospital Stay: Payer: Medicare Other

## 2020-12-18 DIAGNOSIS — R9431 Abnormal electrocardiogram [ECG] [EKG]: Secondary | ICD-10-CM

## 2021-01-01 ENCOUNTER — Telehealth: Payer: Self-pay

## 2021-01-01 ENCOUNTER — Ambulatory Visit: Payer: Medicare Other | Admitting: Cardiology

## 2021-01-01 NOTE — Telephone Encounter (Signed)
Please reschedule her appointment back to 2 weeks around January 15, 2021. As we will need turn around time to get the results back from [facility].

## 2021-01-02 NOTE — Telephone Encounter (Signed)
Spoke to patient and appt has been rescheduled

## 2021-01-04 NOTE — Progress Notes (Signed)
Called and spoke to pt regarding results.

## 2021-01-04 NOTE — Progress Notes (Signed)
Tried calling pt no answer wasn't able to leave vm

## 2021-01-04 NOTE — Progress Notes (Signed)
Called and spoke to pt regarding results. Pt voiced understanding.

## 2021-01-08 ENCOUNTER — Ambulatory Visit: Payer: Medicare Other | Admitting: Cardiology

## 2021-01-15 ENCOUNTER — Other Ambulatory Visit: Payer: Self-pay

## 2021-01-15 ENCOUNTER — Encounter: Payer: Self-pay | Admitting: Cardiology

## 2021-01-15 ENCOUNTER — Ambulatory Visit: Payer: Medicare Other | Admitting: Cardiology

## 2021-01-15 VITALS — BP 170/86 | HR 112 | Temp 97.9°F | Resp 16 | Ht 67.0 in | Wt 126.6 lb

## 2021-01-15 DIAGNOSIS — I471 Supraventricular tachycardia: Secondary | ICD-10-CM

## 2021-01-15 DIAGNOSIS — R002 Palpitations: Secondary | ICD-10-CM

## 2021-01-15 DIAGNOSIS — R9431 Abnormal electrocardiogram [ECG] [EKG]: Secondary | ICD-10-CM

## 2021-01-15 DIAGNOSIS — E7800 Pure hypercholesterolemia, unspecified: Secondary | ICD-10-CM

## 2021-01-15 MED ORDER — METOPROLOL SUCCINATE ER 25 MG PO TB24: 25.0000 mg | ORAL_TABLET | Freq: Every morning | ORAL | 0 refills | Status: DC

## 2021-01-15 MED ORDER — ROSUVASTATIN CALCIUM 20 MG PO TABS: 20.0000 mg | ORAL_TABLET | Freq: Every day | ORAL | 0 refills | Status: DC

## 2021-01-15 NOTE — Progress Notes (Signed)
Date:  01/15/2021   ID:  Sheryl Mejia, DOB Feb 01, 1946, MRN [id number]  PCP:  Sheryl Gaskins, FNP  Cardiologist:  Rex Kras, DO, [facility] (established care 11/27/2020)  Date: 01/15/21 Last Office Visit: 11/27/2020  Chief Complaint  Patient presents with   Palpitations   Follow-up    5 weeks    HPI  Sheryl Mejia is a 75 y.o. female who presents to the office with a chief complaint of " follow up for palpitations and review test results." Patient's past medical history and cardiovascular risk factors include: History of hyperlipidemia currently not on statin therapy, anxiety, postmenopausal female.  She is referred to the office at the request of Sheryl Mejia, * for evaluation of palpitations.  Patient presented to the office for evaluation of palpitations.  No identifiable reversible causes, outside labs were independently reviewed and TSH was within normal limits.  She underwent a 14-day extended Holter monitor which notes underlying rhythm to be predominantly sinus with episodes of supraventricular tachycardia and paroxysmal atrial tachycardia as well.  She states that since the last office visit her symptoms have improved but still present at times.  In addition, at the last visit EKG noted ST-T changes in the inferolateral leads most likely secondary to elevated heart rate but underlying ischemia cannot be ruled out.  However given her history of hyperlipidemia uncorrected and advanced age and the shared decision was to proceed with stress test.  Stress test was noted to be of low risk and echocardiogram notes preserved LVEF.  Patient had fasting lipid profile done with her primary care provider which notes significantly elevated LDL levels.  She is requesting assistance with regards to management of her hyperlipidemia as she is in the process of reestablishing care with PCP at her current practice as her former providers have left the practice.  In addition, the last office  visit patient's blood pressure was significantly elevated.  She brings in a log of her blood pressures for me to review.  Her systolic blood pressures at home are relatively well controlled with SBP ranging between 120-135 mmHg.  FUNCTIONAL STATUS: No structured exercise program or daily routine.    ALLERGIES: No Known Allergies  MEDICATION LIST PRIOR TO VISIT: Current Meds  Medication Sig   Calcium Citrate-Vitamin D (CALCIUM CITRATE + D PO) Take by mouth.   fish oil-omega-3 fatty acids 1000 MG capsule Take 2 g by mouth 2 (two) times daily.   Flaxseed, Linseed, (FLAXSEED OIL PO) Take by mouth.   Glucosamine-Chondroit-Vit C-Mn (GLUCOSAMINE 1500 COMPLEX PO) Take by mouth.   metoprolol succinate (TOPROL XL) 25 MG 24 hr tablet Take 1 tablet (25 mg total) by mouth in the morning.   rosuvastatin (CRESTOR) 20 MG tablet Take 1 tablet (20 mg total) by mouth at bedtime.   vitamin E 180 MG (400 UNITS) capsule Take 400 Units by mouth daily.     PAST MEDICAL HISTORY: Past Medical History:  Diagnosis Date   Hyperlipidemia    No pertinent past medical history    Osteoporosis    SVT (supraventricular tachycardia) ([facility])     PAST SURGICAL HISTORY: Past Surgical History:  Procedure Laterality Date   ANKLE SURGERY  2001   lt ankle-orif   BONY PELVIS SURGERY  2001   lt hip orif   BREAST SURGERY  02,98   x2-rt br bx   COLONOSCOPY      FAMILY HISTORY: The patient family history includes Cancer in her father, maternal aunt, mother,  paternal grandfather, and paternal uncle.  SOCIAL HISTORY:  The patient  reports that she has never smoked. She has never used smokeless tobacco. She reports that she does not drink alcohol and does not use drugs.  REVIEW OF SYSTEMS: Review of Systems  Constitutional: Negative for chills and fever.  HENT:  Negative for hoarse voice and nosebleeds.   Eyes:  Negative for discharge, double vision and pain.  Cardiovascular:  Positive for palpitations. Negative for  chest pain, claudication, dyspnea on exertion, leg swelling, near-syncope, orthopnea, paroxysmal nocturnal dyspnea and syncope.  Respiratory:  Negative for hemoptysis and shortness of breath.   Musculoskeletal:  Negative for muscle cramps and myalgias.  Gastrointestinal:  Negative for abdominal pain, constipation, diarrhea, hematemesis, hematochezia, melena, nausea and vomiting.  Neurological:  Negative for dizziness and light-headedness.   PHYSICAL EXAM: Vitals with BMI 01/15/2021 01/15/2021 11/27/2020  Height - '5\' 7"'  -  Weight - 126 lbs 10 oz -  BMI - 33.54 -  Systolic [phone]  Diastolic 86 87 96  Pulse 734 97 96   CONSTITUTIONAL: Well-developed and well-nourished. No acute distress.  SKIN: Skin is warm and dry. No rash noted. No cyanosis. No pallor. No jaundice HEAD: Normocephalic and atraumatic.  EYES: No scleral icterus MOUTH/THROAT: Moist oral membranes.  NECK: No JVD present. No thyromegaly noted. No carotid bruits  LYMPHATIC: No visible cervical adenopathy.  CHEST Normal respiratory effort. No intercostal retractions  LUNGS: Clear to auscultation bilaterally.  No stridor. No wheezes. No rales.  CARDIOVASCULAR: Tachycardia, regular, no murmurs rubs or gallops appreciated secondary to tachycardia. ABDOMINAL: Nonobese, soft, nontender, nondistended, positive bowel sounds in all 4 quadrants no apparent ascites.  EXTREMITIES: No peripheral edema.  Surgical scars over the left heel are well-healed. HEMATOLOGIC: No significant bruising NEUROLOGIC: Oriented to person, place, and time. Nonfocal. Normal muscle tone.  PSYCHIATRIC: Normal mood and affect. Normal behavior. Cooperative  CARDIAC DATABASE: EKG: 11/27/2020: Sinus  Tachycardia, 108bpm, consider old anteroseptal infarct, ST depression inferolateral, without underlying injury pattern.  Echocardiogram: 12/18/2020: Left ventricle cavity is normal in size and wall thickness. Normal global wall motion. Normal LV systolic function  with EF 60%. Normal diastolic filling pattern. Structurally normal tricuspid valve. No evidence of tricuspid stenosis. Mild tricuspid regurgitation. No evidence of pulmonary hypertension.   Stress Testing: Exercise Myoview stress test 12/17/2020: 1 Day Rest/Stress Protocol. Patient exercised for 3 minutes and 51 seconds on Bruce protocol, achieved 5.67 METS, and 86% of maximum predicted heart rate. Stress ECG negative for ischemia Normal myocardial perfusion without convincing evidence of reversible ischemia or prior infarct. LVEF per gated SPECT 77% and preserved wall thickness without regional wall motion abnormalities. Low risk study.  Heart Catheterization: None   14 day extended Holter monitor: Dominant rhythm normal sinus rhythm. Heart rate 50-203 bpm.  Avg HR 81 bpm. No atrial fibrillation, NSVT, high grade AV block, pauses (3 seconds or longer). Total ventricular ectopic burden <1%. Total supraventricular ectopic burden <1%. Episodes of supraventricular and atrial tachycardia with the fastest interval lasting 7 beats at a max HR 203 bpm. Patient triggered events: 0.  LABORATORY DATA: CBC Latest Ref Rng & Units 02/23/2012 02/18/2012  WBC 4.0 - 10.5 K/uL - 4.5  Hemoglobin 12.0 - 15.0 g/dL 14.6 12.9  Hematocrit 36.0 - 46.0 % - 36.9  Platelets 150 - 400 K/uL - 217    CMP Latest Ref Rng & Units 02/18/2012  Glucose 70 - 99 mg/dL 89  BUN 6 - 23 mg/dL 20  Creatinine 0.50 -  1.10 mg/dL 0.78  Sodium 135 - 145 mEq/L 139  Potassium 3.5 - 5.1 mEq/L 4.5  Chloride 96 - 112 mEq/L 102  CO2 19 - 32 mEq/L 29  Calcium 8.4 - 10.5 mg/dL 10.2  Total Protein 6.0 - 8.3 g/dL 7.2  Total Bilirubin 0.3 - 1.2 mg/dL 0.5  Alkaline Phos 39 - 117 U/L 69  AST 0 - 37 U/L 25  ALT 0 - 35 U/L 23    Lipid Panel  No results found for: CHOL, TRIG, HDL, CHOLHDL, VLDL, LDLCALC, LDLDIRECT, LABVLDL  No components found for: NTPROBNP No results for input(s): PROBNP in the last 8760 hours. No results for  input(s): TSH in the last 8760 hours.  BMP No results for input(s): NA, K, CL, CO2, GLUCOSE, BUN, CREATININE, CALCIUM, GFRNONAA, GFRAA in the last 8760 hours.  HEMOGLOBIN A1C No results found for: HGBA1C, MPG  External Labs: Collected: 10/31/2020. Hemoglobin 13.3 g/dL, hematocrit 38.6% TSH 2.75. Sodium 139, potassium 4.8, chloride 102, bicarb 31, BUN 17, creatinine 0.71  Lipid profile: Collected: 12/17/2020  Total cholesterol 286, triglycerides 100, HDL 69, direct LDL 198, non-HDL 217.    IMPRESSION:    ICD-10-CM   1. Palpitations  R00.2 metoprolol succinate (TOPROL XL) 25 MG 24 hr tablet    2. SVT (supraventricular tachycardia) ([facility])  I47.1     3. Abnormal electrocardiogram  R94.31     4. Pure hypercholesterolemia  E78.00 rosuvastatin (CRESTOR) 20 MG tablet    Lipid Panel With LDL/HDL Ratio    LDL cholesterol, direct    CMP14+EGFR       RECOMMENDATIONS: Sheryl Mejia is a 75 y.o. female whose past medical history and cardiac risk factors include: History of hyperlipidemia currently not on statin therapy, anxiety, postmenopausal female.  Palpitations: No known reversible causes. Outside labs independently reviewed. TSH within normal limits. 14-day extended monitor results reviewed findings noted above for further reference. Will start metoprolol succinate 25 mg p.o. daily  Hyperlipidemia: Start rosuvastatin 20 mg p.o. nightly. Medication profile discussed. At the time of statin therapy initiation LDL 198 mg/dL (12/2020 labs available in Care Everywhere) Check fasting lipid profile in 6 weeks to reevaluate therapy and LFTs. Patient requested that I help with lipid management as her current provider has left the practice and is in the process of reestablishing care with PCP. Outside records from care everywhere reviewed and findings noted above for further reference.  Is a part of this office visit reviewed the results of the echo, GXT, and 14-day extended Holter monitor  results with the patient in great detail and also reviewed outside labs independently and findings noted above for further reference.  FINAL MEDICATION LIST END OF ENCOUNTER: Meds ordered this encounter  Medications   metoprolol succinate (TOPROL XL) 25 MG 24 hr tablet    Sig: Take 1 tablet (25 mg total) by mouth in the morning.    Dispense:  90 tablet    Refill:  0   rosuvastatin (CRESTOR) 20 MG tablet    Sig: Take 1 tablet (20 mg total) by mouth at bedtime.    Dispense:  90 tablet    Refill:  0     There are no discontinued medications.    Current Outpatient Medications:    Calcium Citrate-Vitamin D (CALCIUM CITRATE + D PO), Take by mouth., Disp: , Rfl:    fish oil-omega-3 fatty acids 1000 MG capsule, Take 2 g by mouth 2 (two) times daily., Disp: , Rfl:    Flaxseed, Linseed, (FLAXSEED  OIL PO), Take by mouth., Disp: , Rfl:    Glucosamine-Chondroit-Vit C-Mn (GLUCOSAMINE 1500 COMPLEX PO), Take by mouth., Disp: , Rfl:    metoprolol succinate (TOPROL XL) 25 MG 24 hr tablet, Take 1 tablet (25 mg total) by mouth in the morning., Disp: 90 tablet, Rfl: 0   rosuvastatin (CRESTOR) 20 MG tablet, Take 1 tablet (20 mg total) by mouth at bedtime., Disp: 90 tablet, Rfl: 0   vitamin E 180 MG (400 UNITS) capsule, Take 400 Units by mouth daily., Disp: , Rfl:   Orders Placed This Encounter  Procedures   Lipid Panel With LDL/HDL Ratio   LDL cholesterol, direct   CMP14+EGFR    There are no Patient Instructions on file for this visit.   --Continue cardiac medications as reconciled in final medication list. --Return in about 7 weeks (around 03/05/2021) for Follow up, Lipid, SVT. Or sooner if needed. --Continue follow-up with your primary care physician regarding the management of your other chronic comorbid conditions.  Patient's questions and concerns were addressed to her satisfaction. She voices understanding of the instructions provided during this encounter.   This note was created using a  voice recognition software as a result there may be grammatical errors inadvertently enclosed that do not reflect the nature of this encounter. Every attempt is made to correct such errors.   Rex Kras, [place], [facility]  Pager: [phone] Office: [phone]

## 2021-02-01 ENCOUNTER — Other Ambulatory Visit: Payer: Self-pay

## 2021-02-01 DIAGNOSIS — E7800 Pure hypercholesterolemia, unspecified: Secondary | ICD-10-CM

## 2021-03-05 ENCOUNTER — Ambulatory Visit: Payer: Medicare Other | Admitting: Cardiology

## 2021-03-14 LAB — LIPID PANEL WITH LDL/HDL RATIO
Cholesterol, Total: 154 mg/dL (ref 100–199)
HDL: 76 mg/dL (ref >39)
LDL Chol Calc (NIH): 66 mg/dL (ref 0–99)
LDL/HDL Ratio: 0.9 ratio (ref 0.0–3.2)
Triglycerides: 55 mg/dL (ref 0–149)
VLDL Cholesterol Cal: 12 mg/dL (ref 5–40)

## 2021-03-14 LAB — LDL CHOLESTEROL, DIRECT: LDL Direct: 64 mg/dL (ref 0–99)

## 2021-03-18 ENCOUNTER — Encounter: Payer: Self-pay | Admitting: Cardiology

## 2021-03-18 ENCOUNTER — Ambulatory Visit: Payer: Medicare Other | Admitting: Cardiology

## 2021-03-18 ENCOUNTER — Other Ambulatory Visit: Payer: Self-pay

## 2021-03-18 VITALS — BP 169/91 | HR 80 | Temp 98.7°F | Ht 67.0 in | Wt 129.8 lb

## 2021-03-18 DIAGNOSIS — R002 Palpitations: Secondary | ICD-10-CM

## 2021-03-18 DIAGNOSIS — E7800 Pure hypercholesterolemia, unspecified: Secondary | ICD-10-CM

## 2021-03-18 MED ORDER — ROSUVASTATIN CALCIUM 10 MG PO TABS: 10.0000 mg | ORAL_TABLET | Freq: Every day | ORAL | 0 refills | Status: DC

## 2021-03-18 NOTE — Progress Notes (Signed)
Date:  03/18/2021   ID:  Sheryl Mejia, DOB [birth year], MRN [id number]  PCP:  Joya Gaskins, FNP  Cardiologist:  Rex Kras, DO, [facility] (established care 11/27/2020)  Date: 03/18/21 Last Office Visit: 01/15/2021  Chief Complaint  Patient presents with   Results   Palpitations   Hyperlipidemia    HPI  Sheryl Mejia is a 75 y.o. female who presents to the office with a chief complaint of " reevaluation of palpitations/hyperlipidemia and discuss test results." Patient's past medical history and cardiovascular risk factors include: History of hyperlipidemia currently not on statin therapy, anxiety, postmenopausal female.  She is referred to the office at the request of Joya Gaskins, * for evaluation of palpitations.  Initially presented to the office for evaluation of palpitations.  No identifiable reversible causes, TSH and labs were within normal limits.  She did undergo 14-day extended Holter monitor which noted that the underlying rhythm is predominantly normal sinus with rare episodes of paroxysmal supraventricular tachycardia and atrial tachycardia.  Shared decision was to start Toprol-XL and her symptoms are very well controlled since last office visit.  She also had an EKG performed since establishing care which noted inferolateral leads having ST-T changes and given her age and risk factors she did undergo an ischemic evaluation.  Results were reviewed as a part of today's office encounter noted below for further reference.  At the last office visit patient also requested my assistance with regards to the management of her hyperlipidemia.  She had a blood work done with her PCP which noted LDL levels of 198 mg/dL.  At last office visit we started her on Crestor 20 mg p.o. nightly and she had repeat blood work on March 13, 2021 which were independently reviewed and noted below for further reference.  Otherwise patient is doing very well from a cardiovascular standpoint.   She does bring me a log of her blood pressures and her systolic blood pressures are very well controlled with SBP ranging around 962 mmHg, diastolic blood pressures ranging between 70-80 mmHg.  FUNCTIONAL STATUS: No structured exercise program or daily routine.    ALLERGIES: No Known Allergies  MEDICATION LIST PRIOR TO VISIT: Current Meds  Medication Sig   Calcium Citrate-Vitamin D (CALCIUM CITRATE + D PO) Take by mouth.   fish oil-omega-3 fatty acids 1000 MG capsule Take 2 g by mouth 2 (two) times daily.   Flaxseed, Linseed, (FLAXSEED OIL PO) Take by mouth.   Glucosamine-Chondroit-Vit C-Mn (GLUCOSAMINE 1500 COMPLEX PO) Take by mouth.   metoprolol succinate (TOPROL XL) 25 MG 24 hr tablet Take 1 tablet (25 mg total) by mouth in the morning.   Multiple Vitamins-Minerals (PRESERVISION AREDS) CAPS Take 1 capsule by mouth daily.   vitamin E 180 MG (400 UNITS) capsule Take 400 Units by mouth daily.   [DISCONTINUED] rosuvastatin (CRESTOR) 20 MG tablet Take 1 tablet (20 mg total) by mouth at bedtime.     PAST MEDICAL HISTORY: Past Medical History:  Diagnosis Date   Hyperlipidemia    No pertinent past medical history    Osteoporosis    SVT (supraventricular tachycardia) ([place])     PAST SURGICAL HISTORY: Past Surgical History:  Procedure Laterality Date   ANKLE SURGERY  2001   lt ankle-orif   BONY PELVIS SURGERY  2001   lt hip orif   BREAST SURGERY  02,98   x2-rt br bx   COLONOSCOPY      FAMILY HISTORY: The patient family history includes Cancer  in her father, maternal aunt, mother, paternal grandfather, and paternal uncle.  SOCIAL HISTORY:  The patient  reports that she has never smoked. She has never used smokeless tobacco. She reports that she does not drink alcohol and does not use drugs.  REVIEW OF SYSTEMS: Review of Systems  Constitutional: Negative for chills and fever.  HENT:  Negative for hoarse voice and nosebleeds.   Eyes:  Negative for discharge, double vision  and pain.  Cardiovascular:  Negative for chest pain, claudication, dyspnea on exertion, leg swelling, near-syncope, orthopnea, palpitations, paroxysmal nocturnal dyspnea and syncope.  Respiratory:  Negative for hemoptysis and shortness of breath.   Musculoskeletal:  Negative for muscle cramps and myalgias.  Gastrointestinal:  Negative for abdominal pain, constipation, diarrhea, hematemesis, hematochezia, melena, nausea and vomiting.  Neurological:  Negative for dizziness and light-headedness.   PHYSICAL EXAM: Vitals with BMI 03/18/2021 01/15/2021 01/15/2021  Height 5\' 7"  - 5\' 7"   Weight 129 lbs 13 oz - 126 lbs 10 oz  BMI 90.30 - 09.23  Systolic 300 762 263  Diastolic 91 86 87  Pulse 80 112 97   CONSTITUTIONAL: Well-developed and well-nourished. No acute distress.  SKIN: Skin is warm and dry. No rash noted. No cyanosis. No pallor. No jaundice HEAD: Normocephalic and atraumatic.  EYES: No scleral icterus MOUTH/THROAT: Moist oral membranes.  NECK: No JVD present. No thyromegaly noted. No carotid bruits  LYMPHATIC: No visible cervical adenopathy.  CHEST Normal respiratory effort. No intercostal retractions  LUNGS: Clear to auscultation bilaterally.  No stridor. No wheezes. No rales.  CARDIOVASCULAR: Tachycardia, regular, no murmurs rubs or gallops appreciated secondary to tachycardia. ABDOMINAL: Nonobese, soft, nontender, nondistended, positive bowel sounds in all 4 quadrants no apparent ascites.  EXTREMITIES: No peripheral edema.  Surgical scars over the left heel are well-healed. HEMATOLOGIC: No significant bruising NEUROLOGIC: Oriented to person, place, and time. Nonfocal. Normal muscle tone.  PSYCHIATRIC: Normal mood and affect. Normal behavior. Cooperative  CARDIAC DATABASE: EKG: 11/27/2020: Sinus  Tachycardia, 108bpm, consider old anteroseptal infarct, ST depression inferolateral, without underlying injury pattern.  Echocardiogram: 12/18/2020: Left ventricle cavity is normal in  size and wall thickness. Normal global wall motion. Normal LV systolic function with EF 60%. Normal diastolic filling pattern. Structurally normal tricuspid valve. No evidence of tricuspid stenosis. Mild tricuspid regurgitation. No evidence of pulmonary hypertension.   Stress Testing: Exercise Myoview stress test 12/17/2020: 1 Day Rest/Stress Protocol. Patient exercised for 3 minutes and 51 seconds on Bruce protocol, achieved 5.67 METS, and 86% of maximum predicted heart rate. Stress ECG negative for ischemia Normal myocardial perfusion without convincing evidence of reversible ischemia or prior infarct. LVEF per gated SPECT 77% and preserved wall thickness without regional wall motion abnormalities. Low risk study.  Heart Catheterization: None   14 day extended Holter monitor: Dominant rhythm normal sinus rhythm. Heart rate 50-203 bpm.  Avg HR 81 bpm. No atrial fibrillation, NSVT, high grade AV block, pauses (3 seconds or longer). Total ventricular ectopic burden <1%. Total supraventricular ectopic burden <1%. Episodes of supraventricular and atrial tachycardia with the fastest interval lasting 7 beats at a max HR 203 bpm. Patient triggered events: 0.  LABORATORY DATA: CBC Latest Ref Rng & Units 02/23/2012 02/18/2012  WBC 4.0 - 10.5 K/uL - 4.5  Hemoglobin 12.0 - 15.0 g/dL 14.6 12.9  Hematocrit 36.0 - 46.0 % - 36.9  Platelets 150 - 400 K/uL - 217    CMP Latest Ref Rng & Units 02/18/2012  Glucose 70 - 99 mg/dL 89  BUN 6 -  23 mg/dL 20  Creatinine 0.50 - 1.10 mg/dL 0.78  Sodium 135 - 145 mEq/L 139  Potassium 3.5 - 5.1 mEq/L 4.5  Chloride 96 - 112 mEq/L 102  CO2 19 - 32 mEq/L 29  Calcium 8.4 - 10.5 mg/dL 10.2  Total Protein 6.0 - 8.3 g/dL 7.2  Total Bilirubin 0.3 - 1.2 mg/dL 0.5  Alkaline Phos 39 - 117 U/L 69  AST 0 - 37 U/L 25  ALT 0 - 35 U/L 23    Lipid Panel     Component Value Date/Time   CHOL 154 03/13/2021 0928   TRIG 55 03/13/2021 0928   HDL 76 03/13/2021 0928    LDLCALC 66 03/13/2021 0928   LDLDIRECT 64 03/13/2021 0927   LABVLDL 12 03/13/2021 0928    No components found for: NTPROBNP No results for input(s): PROBNP in the last 8760 hours. No results for input(s): TSH in the last 8760 hours.  BMP No results for input(s): NA, K, CL, CO2, GLUCOSE, BUN, CREATININE, CALCIUM, GFRNONAA, GFRAA in the last 8760 hours.  HEMOGLOBIN A1C No results found for: HGBA1C, MPG  External Labs: Collected: 10/31/2020. Hemoglobin 13.3 g/dL, hematocrit 38.6% TSH 2.75. Sodium 139, potassium 4.8, chloride 102, bicarb 31, BUN 17, creatinine 0.71  Lipid profile: Collected: 12/17/2020  Total cholesterol 286, triglycerides 100, HDL 69, direct LDL 198, non-HDL 217.    IMPRESSION:    ICD-10-CM   1. Pure hypercholesterolemia  E78.00 rosuvastatin (CRESTOR) 10 MG tablet    2. Palpitations  R00.2         RECOMMENDATIONS: PATRYCE DEPRIEST is a 75 y.o. female whose past medical history and cardiac risk factors include: hyperlipidemia, anxiety, postmenopausal female.  Pure hypercholesterolemia Prior to establishing care her LDL levels were 198 mg/dL as of July 2022. Patient requested my assistance with regards to her lipid management as her primary care provider had left practice and was in the process of reestablishing care. I started her on Crestor 20 mg p.o. nightly back in August 2022. She was requested to have blood work in 6 weeks to reevaluate therapy. Most recent lipid profile notes a significant improvement in her cholesterol levels with LDL levels downtrending from 198 mg/dL to 64 mg/dL. She is tolerating the medication well without any side effects or intolerances. Will reduce Crestor to 10mg  po qhs and will recheck in 6 months.   Palpitations No known reversible causes. Outside labs independently reviewed. TSH within normal limits. 14-day extended monitor results reviewed findings noted above for further reference. Continue metoprolol succinate 25 mg p.o.  daily  FINAL MEDICATION LIST END OF ENCOUNTER: Meds ordered this encounter  Medications   rosuvastatin (CRESTOR) 10 MG tablet    Sig: Take 1 tablet (10 mg total) by mouth at bedtime.    Dispense:  90 tablet    Refill:  0     Medications Discontinued During This Encounter  Medication Reason   rosuvastatin (CRESTOR) 20 MG tablet       Current Outpatient Medications:    Calcium Citrate-Vitamin D (CALCIUM CITRATE + D PO), Take by mouth., Disp: , Rfl:    fish oil-omega-3 fatty acids 1000 MG capsule, Take 2 g by mouth 2 (two) times daily., Disp: , Rfl:    Flaxseed, Linseed, (FLAXSEED OIL PO), Take by mouth., Disp: , Rfl:    Glucosamine-Chondroit-Vit C-Mn (GLUCOSAMINE 1500 COMPLEX PO), Take by mouth., Disp: , Rfl:    metoprolol succinate (TOPROL XL) 25 MG 24 hr tablet, Take 1 tablet (25 mg total)  by mouth in the morning., Disp: 90 tablet, Rfl: 0   Multiple Vitamins-Minerals (PRESERVISION AREDS) CAPS, Take 1 capsule by mouth daily., Disp: , Rfl:    vitamin E 180 MG (400 UNITS) capsule, Take 400 Units by mouth daily., Disp: , Rfl:    rosuvastatin (CRESTOR) 10 MG tablet, Take 1 tablet (10 mg total) by mouth at bedtime., Disp: 90 tablet, Rfl: 0  No orders of the defined types were placed in this encounter.   There are no Patient Instructions on file for this visit.   --Continue cardiac medications as reconciled in final medication list. --No follow-ups on file. Or sooner if needed. --Continue follow-up with your primary care physician regarding the management of your other chronic comorbid conditions.  Patient's questions and concerns were addressed to her satisfaction. She voices understanding of the instructions provided during this encounter.   This note was created using a voice recognition software as a result there may be grammatical errors inadvertently enclosed that do not reflect the nature of this encounter. Every attempt is made to correct such errors.   Rex Kras, [place],  [facility]  Pager: [phone] Office: [phone]

## 2021-04-03 ENCOUNTER — Other Ambulatory Visit: Payer: Self-pay | Admitting: Cardiology

## 2021-04-03 DIAGNOSIS — R002 Palpitations: Secondary | ICD-10-CM

## 2021-06-05 ENCOUNTER — Other Ambulatory Visit: Payer: Self-pay | Admitting: Family Medicine

## 2021-06-05 DIAGNOSIS — R221 Localized swelling, mass and lump, neck: Secondary | ICD-10-CM

## 2021-06-14 ENCOUNTER — Ambulatory Visit
Admission: RE | Admit: 2021-06-14 | Discharge: 2021-06-14 | Disposition: A | Discharge status: Home / Self Care | Payer: Medicare Other | Source: Ambulatory Visit | Attending: Family Medicine | Admitting: Family Medicine

## 2021-06-14 DIAGNOSIS — R221 Localized swelling, mass and lump, neck: Secondary | ICD-10-CM

## 2021-07-09 ENCOUNTER — Other Ambulatory Visit: Payer: Self-pay | Admitting: Cardiology

## 2021-07-09 DIAGNOSIS — R002 Palpitations: Secondary | ICD-10-CM

## 2021-07-09 DIAGNOSIS — E7800 Pure hypercholesterolemia, unspecified: Secondary | ICD-10-CM

## 2021-09-10 ENCOUNTER — Other Ambulatory Visit: Payer: Self-pay | Admitting: Cardiology

## 2021-09-10 DIAGNOSIS — E7800 Pure hypercholesterolemia, unspecified: Secondary | ICD-10-CM

## 2021-09-13 LAB — LIPOPROTEIN A (LPA): Lipoprotein (a): 25.1 nmol/L (ref <75.0)

## 2021-09-13 LAB — CMP14+EGFR
ALT: 15 IU/L (ref 0–32)
AST: 19 IU/L (ref 0–40)
Albumin/Globulin Ratio: 2.7 — ABNORMAL HIGH (ref 1.2–2.2)
Albumin: 4.8 g/dL — ABNORMAL HIGH (ref 3.7–4.7)
Alkaline Phosphatase: 75 IU/L (ref 44–121)
BUN/Creatinine Ratio: 20 (ref 12–28)
BUN: 18 mg/dL (ref 8–27)
Bilirubin Total: 0.6 mg/dL (ref 0.0–1.2)
CO2: 24 mmol/L (ref 20–29)
Calcium: 9.6 mg/dL (ref 8.7–10.3)
Chloride: 99 mmol/L (ref 96–106)
Creatinine, Ser: 0.88 mg/dL (ref 0.57–1.00)
Globulin, Total: 1.8 g/dL (ref 1.5–4.5)
Glucose: 97 mg/dL (ref 70–99)
Potassium: 4.5 mmol/L (ref 3.5–5.2)
Sodium: 138 mmol/L (ref 134–144)
Total Protein: 6.6 g/dL (ref 6.0–8.5)
eGFR: 68 mL/min/{1.73_m2} (ref >59)

## 2021-09-13 LAB — APO A1 + B + RATIO
Apolipo. B/A-1 Ratio: 0.3 ratio (ref 0.0–0.6)
Apolipoprotein A-1: 207 mg/dL (ref 116–209)
Apolipoprotein B: 71 mg/dL (ref <90)

## 2021-09-16 ENCOUNTER — Encounter: Payer: Self-pay | Admitting: Cardiology

## 2021-09-16 ENCOUNTER — Ambulatory Visit: Payer: Medicare Other | Admitting: Cardiology

## 2021-09-16 VITALS — BP 153/90 | HR 90 | Temp 98.3°F | Resp 16 | Ht 67.0 in | Wt 116.8 lb

## 2021-09-16 DIAGNOSIS — I471 Supraventricular tachycardia: Secondary | ICD-10-CM

## 2021-09-16 DIAGNOSIS — R002 Palpitations: Secondary | ICD-10-CM

## 2021-09-16 DIAGNOSIS — E7800 Pure hypercholesterolemia, unspecified: Secondary | ICD-10-CM

## 2021-09-16 NOTE — Progress Notes (Signed)
? ?ID:  Sheryl Mejia, DOB November 27, 1945, MRN [id number] ? ?PCP:  Joya Gaskins, FNP  ?Cardiologist:  Rex Kras, DO, [facility] (established care 11/27/2020) ? ?Date: 09/16/21 ?Last Office Visit: 03/18/2021 ? ?Chief Complaint  ?Patient presents with  ? Hyperlipidemia  ? Palpitations  ? Follow-up  ? ? ?HPI  ?Sheryl Mejia is a 76 y.o. female whose past medical history and cardiovascular risk factors include: History of hyperlipidemia currently not on statin therapy, anxiety, postmenopausal female. ? ?Presents for 34-monthfollow-up for palpitations and hyperlipidemia management. ? ?In the past patient is LDL levels were not well controlled and up to 198 mg/dL.  Since then she was started on Crestor 20 mg p.o. nightly and her repeat blood work in October 2022 noted LDL was <70 mg/dL.  At the last office visit her Crestor was reduced to 10 mg p.o. nightly with a goal of repeating fasting lipid profile prior to today's visit.  However, her lipid profile is still pending.  She is tolerating her statin medications well without any side effects or intolerances.  Her apolipoprotein's and LP(a) are within normal limits. ? ?With regards to her palpitations she has remained asymptomatic since initiation of Toprol-XL.  Had episodes of SVT and atrial tachycardia on a 14-day extended Holter monitor. ? ?During prior office visits patient's blood pressure were not well controlled.  At last visit she was requested to keep a log of her blood pressures.  Her blood pressure log reviewed at today's visit and upon visual estimation her SBP is ranging between 115-130 mmHg and DBP between 70-80 mmHg.  She is increased her walking on a daily basis and also is eating healthier and trying to consume a low-salt diet. ? ?FUNCTIONAL STATUS: ?No structured exercise program or daily routine. ?  ? ?ALLERGIES: ?No Known Allergies ? ?MEDICATION LIST PRIOR TO VISIT: ?Current Meds  ?Medication Sig  ? Calcium Citrate-Vitamin D (CALCIUM CITRATE + D PO) Take  by mouth.  ? fish oil-omega-3 fatty acids 1000 MG capsule Take 2 g by mouth 2 (two) times daily.  ? Flaxseed, Linseed, (FLAXSEED OIL PO) Take by mouth.  ? Glucosamine-Chondroit-Vit C-Mn (GLUCOSAMINE 1500 COMPLEX PO) Take by mouth.  ? metoprolol succinate (TOPROL-XL) 25 MG 24 hr tablet TAKE 1 TABLET BY MOUTH IN THE MORNING  ? Multiple Vitamins-Minerals (PRESERVISION AREDS) CAPS Take 1 capsule by mouth daily.  ? rosuvastatin (CRESTOR) 10 MG tablet TAKE 1 TABLET BY MOUTH AT BEDTIME  ? vitamin E 180 MG (400 UNITS) capsule Take 400 Units by mouth daily.  ?  ? ?PAST MEDICAL HISTORY: ?Past Medical History:  ?Diagnosis Date  ? Hyperlipidemia   ? No pertinent past medical history   ? Osteoporosis   ? [place]) [place]   ? ? ?PAST SURGICAL HISTORY: ?Past Surgical History:  ?Procedure Laterality Date  ? ANKLE SURGERY  2001  ? lt ankle-orif  ? BONY PELVIS SURGERY  2001  ? lt hip orif  ? BREAST SURGERY  02,98  ? x2-rt br bx  ? COLONOSCOPY    ? ? ?FAMILY HISTORY: ?The patient family history includes Cancer in her father, maternal aunt, mother, paternal grandfather, and paternal uncle. ? ?SOCIAL HISTORY:  ?The patient  reports that she has never smoked. She has never used smokeless tobacco. She reports that she does not drink alcohol and does not use drugs. ? ?REVIEW OF SYSTEMS: ?Review of Systems  ?Cardiovascular:  Negative for chest pain, dyspnea on exertion, leg swelling, near-syncope, orthopnea, palpitations and  syncope.  ? ?PHYSICAL EXAM: ? ?  09/16/2021  ?  1:28 PM 03/18/2021  ?  1:20 PM 01/15/2021  ?  1:58 PM  ?Vitals with BMI  ?Height '5\' 7"'$  '5\' 7"'$    ?Weight 116 lbs 13 oz 129 lbs 13 oz   ?BMI 18.29 20.32   ?Systolic 326 712 458  ?Diastolic 90 91 86  ?Pulse 90 80 112  ? ?CONSTITUTIONAL: Well-developed and well-nourished. No acute distress.  ?SKIN: Skin is warm and dry. No rash noted. No cyanosis. No pallor. No jaundice ?HEAD: Normocephalic and atraumatic.  ?EYES: No scleral icterus ?MOUTH/THROAT: Moist  oral membranes.  ?NECK: No JVD present. No thyromegaly noted. No carotid bruits  ?LYMPHATIC: No visible cervical adenopathy.  ?CHEST Normal respiratory effort. No intercostal retractions  ?LUNGS: Clear to auscultation bilaterally.  No stridor. No wheezes. No rales.  ?CARDIOVASCULAR: Regular, positive S1-S2, no murmurs rubs or gallops appreciated. ?ABDOMINAL: Nonobese, soft, nontender, nondistended, positive bowel sounds in all 4 quadrants no apparent ascites.  ?EXTREMITIES: No peripheral edema.  Surgical scars over the left heel are well-healed. ?HEMATOLOGIC: No significant bruising ?NEUROLOGIC: Oriented to person, place, and time. Nonfocal. Normal muscle tone.  ?PSYCHIATRIC: Normal mood and affect. Normal behavior. Cooperative ? ?CARDIAC DATABASE: ?EKG: ?09/16/2021: Normal sinus rhythm, 85 bpm, normal axis, without underlying ischemia or injury pattern. ? ?Echocardiogram: ?12/18/2020: ?Left ventricle cavity is normal in size and wall thickness. Normal global wall motion. Normal LV systolic function with EF 60%. Normal diastolic filling pattern. ?Structurally normal tricuspid valve. No evidence of tricuspid stenosis. Mild tricuspid regurgitation. ?No evidence of pulmonary hypertension. ?  ?Stress Testing: ?Exercise Myoview stress test 12/17/2020: ?1 Day Rest/Stress Protocol. ?Patient exercised for 3 minutes and 51 seconds on Bruce protocol, achieved 5.67 METS, and 86% of maximum predicted heart rate. ?Stress ECG negative for ischemia ?Normal myocardial perfusion without convincing evidence of reversible ischemia or prior infarct. ?LVEF per gated SPECT 77% and preserved wall thickness without regional wall motion abnormalities. ?Low risk study. ? ?Heart Catheterization: ?None  ? ?14 day extended Holter monitor: ?Dominant rhythm normal sinus rhythm. ?Heart rate 50-203 bpm.  Avg HR 81 bpm. ?No atrial fibrillation, NSVT, high grade AV block, pauses (3 seconds or longer). ?Total ventricular ectopic burden <1%. ?Total  supraventricular ectopic burden <1%. ?Episodes of supraventricular and atrial tachycardia with the fastest interval lasting 7 beats at a max HR 203 bpm. ?Patient triggered events: 0. ? ?LABORATORY DATA: ? ?  Latest Ref Rng & Units 02/23/2012  ?  1:14 PM 02/18/2012  ?  4:00 PM  ?CBC  ?WBC 4.0 - 10.5 K/uL  4.5    ?Hemoglobin 12.0 - 15.0 g/dL 14.6   12.9    ?Hematocrit 36.0 - 46.0 %  36.9    ?Platelets 150 - 400 K/uL  217    ? ? ? ?  Latest Ref Rng & Units 09/12/2021  ? 10:37 AM 02/18/2012  ?  4:00 PM  ?CMP  ?Glucose 70 - 99 mg/dL 97   89    ?BUN 8 - 27 mg/dL 18   20    ?Creatinine 0.57 - 1.00 mg/dL 0.88   0.78    ?Sodium 134 - 144 mmol/L 138   139    ?Potassium 3.5 - 5.2 mmol/L 4.5   4.5    ?Chloride 96 - 106 mmol/L 99   102    ?CO2 20 - 29 mmol/L 24   29    ?Calcium 8.7 - 10.3 mg/dL 9.6   10.2    ?  Total Protein 6.0 - 8.5 g/dL 6.6   7.2    ?Total Bilirubin 0.0 - 1.2 mg/dL 0.6   0.5    ?Alkaline Phos 44 - 121 IU/L 75   69    ?AST 0 - 40 IU/L 19   25    ?ALT 0 - 32 IU/L 15   23    ? ? ?Lipid Panel  ?   ?Component Value Date/Time  ? CHOL 154 03/13/2021 0928  ? TRIG 55 03/13/2021 0928  ? HDL 76 03/13/2021 0928  ? [place] 66 03/13/2021 0928  ? LDLDIRECT 64 03/13/2021 0927  ? LABVLDL 12 03/13/2021 0928  ? ? ?No components found for: NTPROBNP ?No results for input(s): PROBNP in the last 8760 hours. ?No results for input(s): TSH in the last 8760 hours. ? ?BMP ?Recent Labs  ?  09/12/21 ?1037  ?NA 138  ?K 4.5  ?CL 99  ?CO2 24  ?GLUCOSE 97  ?BUN 18  ?CREATININE 0.88  ?CALCIUM 9.6  ? ? ?HEMOGLOBIN A1C ?No results found for: HGBA1C, MPG ? ?External Labs: ?Collected: 10/31/2020. ?Hemoglobin 13.3 g/dL, hematocrit 38.6% ?TSH 2.75. ?Sodium 139, potassium 4.8, chloride 102, bicarb 31, BUN 17, creatinine 0.71 ? ?Lipid profile: ?Collected: 12/17/2020  ?Total cholesterol 286, triglycerides 100, HDL 69, direct LDL 198, non-HDL 217.   ? ?IMPRESSION: ? ?  ICD-10-CM   ?1. Pure hypercholesterolemia  E78.00 Lipid Panel With LDL/HDL Ratio  ?  LDL  cholesterol, direct  ?  ?2. Palpitations  R00.2 EKG 12-Lead  ?  ?3. SVT (supraventricular tachycardia) ([facility])  I47.1   ?  ? ?  ? ?RECOMMENDATIONS: ?Sheryl Mejia is a 76 y.o. female whose past medical history and ca

## 2021-09-19 LAB — LIPID PANEL WITH LDL/HDL RATIO
Cholesterol, Total: 176 mg/dL (ref 100–199)
HDL: 89 mg/dL (ref >39)
LDL Chol Calc (NIH): 77 mg/dL (ref 0–99)
LDL/HDL Ratio: 0.9 ratio (ref 0.0–3.2)
Triglycerides: 52 mg/dL (ref 0–149)
VLDL Cholesterol Cal: 10 mg/dL (ref 5–40)

## 2021-09-19 LAB — LDL CHOLESTEROL, DIRECT: LDL Direct: 80 mg/dL (ref 0–99)

## 2021-10-02 ENCOUNTER — Other Ambulatory Visit: Payer: Self-pay | Admitting: Cardiology

## 2021-10-02 DIAGNOSIS — E7800 Pure hypercholesterolemia, unspecified: Secondary | ICD-10-CM

## 2021-10-02 DIAGNOSIS — R002 Palpitations: Secondary | ICD-10-CM

## 2021-12-30 ENCOUNTER — Other Ambulatory Visit: Payer: Self-pay | Admitting: Cardiology

## 2021-12-30 DIAGNOSIS — R002 Palpitations: Secondary | ICD-10-CM

## 2021-12-30 DIAGNOSIS — E7800 Pure hypercholesterolemia, unspecified: Secondary | ICD-10-CM

## 2022-03-06 ENCOUNTER — Other Ambulatory Visit: Payer: Self-pay

## 2022-03-06 DIAGNOSIS — E7800 Pure hypercholesterolemia, unspecified: Secondary | ICD-10-CM

## 2022-03-14 LAB — CMP14+EGFR
ALT: 19 IU/L (ref 0–32)
AST: 22 IU/L (ref 0–40)
Albumin/Globulin Ratio: 2.2 (ref 1.2–2.2)
Albumin: 4.9 g/dL — ABNORMAL HIGH (ref 3.8–4.8)
Alkaline Phosphatase: 79 IU/L (ref 44–121)
BUN/Creatinine Ratio: 24 (ref 12–28)
BUN: 20 mg/dL (ref 8–27)
Bilirubin Total: 0.6 mg/dL (ref 0.0–1.2)
CO2: 24 mmol/L (ref 20–29)
Calcium: 9.7 mg/dL (ref 8.7–10.3)
Chloride: 102 mmol/L (ref 96–106)
Creatinine, Ser: 0.82 mg/dL (ref 0.57–1.00)
Globulin, Total: 2.2 g/dL (ref 1.5–4.5)
Glucose: 105 mg/dL — ABNORMAL HIGH (ref 70–99)
Potassium: 4.9 mmol/L (ref 3.5–5.2)
Sodium: 143 mmol/L (ref 134–144)
Total Protein: 7.1 g/dL (ref 6.0–8.5)
eGFR: 74 mL/min/{1.73_m2} (ref >59)

## 2022-03-14 LAB — LIPID PANEL WITH LDL/HDL RATIO
Cholesterol, Total: 185 mg/dL (ref 100–199)
HDL: 98 mg/dL (ref >39)
LDL Chol Calc (NIH): 78 mg/dL (ref 0–99)
LDL/HDL Ratio: 0.8 ratio (ref 0.0–3.2)
Triglycerides: 47 mg/dL (ref 0–149)
VLDL Cholesterol Cal: 9 mg/dL (ref 5–40)

## 2022-03-14 LAB — LDL CHOLESTEROL, DIRECT: LDL Direct: 78 mg/dL (ref 0–99)

## 2022-03-15 ENCOUNTER — Telehealth: Payer: Self-pay | Admitting: Cardiology

## 2022-03-15 NOTE — Telephone Encounter (Signed)
Patient has an appointment on 03/18/2022.  She tested COVID-positive this weekend.  Please schedule her follow-up at least 4 weeks out.  I reviewed the most recent CMP and lipid profile with her over the phone as well.  She requested medications for COVID-19; however, I have deferred that to either her PCP or urgent care she should be evaluated prior to prescribing such medications.  She verbalizes understanding.  Taijah Macrae Picacho, DO, [facility]

## 2022-03-18 ENCOUNTER — Ambulatory Visit: Payer: Medicare Other | Admitting: Cardiology

## 2022-03-21 NOTE — Progress Notes (Signed)
Called and spoke to patient she voiced understanding

## 2022-04-02 ENCOUNTER — Other Ambulatory Visit: Payer: Self-pay | Admitting: Cardiology

## 2022-04-02 DIAGNOSIS — R002 Palpitations: Secondary | ICD-10-CM

## 2022-04-02 DIAGNOSIS — E7800 Pure hypercholesterolemia, unspecified: Secondary | ICD-10-CM

## 2022-04-22 ENCOUNTER — Ambulatory Visit: Payer: Medicare Other

## 2022-04-22 ENCOUNTER — Ambulatory Visit: Payer: Medicare Other | Admitting: Cardiology

## 2022-04-22 VITALS — BP 164/91 | HR 73 | Resp 16 | Ht 67.0 in | Wt 116.0 lb

## 2022-04-22 DIAGNOSIS — I471 Supraventricular tachycardia, unspecified: Secondary | ICD-10-CM

## 2022-04-22 DIAGNOSIS — I10 Essential (primary) hypertension: Secondary | ICD-10-CM

## 2022-04-22 DIAGNOSIS — E7800 Pure hypercholesterolemia, unspecified: Secondary | ICD-10-CM

## 2022-04-22 NOTE — Progress Notes (Signed)
ID:  Sheryl Mejia, DOB 12-28-1945, MRN [id number]  PCP:  Joya Gaskins, FNP  Cardiologist:  Rex Kras, DO, [facility] (established care 11/27/2020)  Date: 04/22/22 Last Office Visit: 03/18/2021  Chief Complaint  Patient presents with   SVT   Hyperlipidemia   Follow-up    6 month    HPI  Sheryl Mejia is a 76 y.o. female whose past medical history and cardiovascular risk factors include: History of hyperlipidemia currently not on statin therapy, anxiety, postmenopausal female.  Presents for 78-monthfollow-up for palpitations and hyperlipidemia management.  In the past patient is LDL levels were not well controlled and up to 198 mg/dL.  Since then she was started on Crestor 20 mg p.o. nightly and her repeat blood work in October 2022 noted LDL was <70 mg/dL.  Crestor was reduced to 10 mg p.o. nightly. Lipids are now under good control. Lipoprotein A within normal range. She does have elevated blood pressure during office visits.  She monitors her blood pressure at home and brings in extensive log of readings.  She is concerned because she has had occasional readings of 140s/80s.  However, overall her blood pressure is well controlled.  She does admit to some recent life stressors including downsizing her home, her sister's health, and her husband needing hip surgery.  She was also recently diagnosed with COVID-19 on 03/15/2022, she has now recovered from this.  Overall, she is doing well without any complaints today.   FUNCTIONAL STATUS: No structured exercise program or daily routine.    ALLERGIES: No Known Allergies  MEDICATION LIST PRIOR TO VISIT: Current Meds  Medication Sig   Calcium Citrate-Vitamin D (CALCIUM CITRATE + D PO) Take by mouth.   fish oil-omega-3 fatty acids 1000 MG capsule Take 2 g by mouth 2 (two) times daily.   Flaxseed, Linseed, (FLAXSEED OIL PO) Take by mouth.   metoprolol succinate (TOPROL-XL) 25 MG 24 hr tablet TAKE 1 TABLET BY MOUTH IN THE MORNING    Multiple Vitamins-Minerals (PRESERVISION AREDS) CAPS Take 1 capsule by mouth daily.   rosuvastatin (CRESTOR) 10 MG tablet TAKE 1 TABLET BY MOUTH AT BEDTIME   vitamin E 180 MG (400 UNITS) capsule Take 400 Units by mouth daily.     PAST MEDICAL HISTORY: Past Medical History:  Diagnosis Date   Hyperlipidemia    No pertinent past medical history    Osteoporosis    SVT (supraventricular tachycardia)     PAST SURGICAL HISTORY: Past Surgical History:  Procedure Laterality Date   ANKLE SURGERY  2001   lt ankle-orif   BONY PELVIS SURGERY  2001   lt hip orif   BREAST SURGERY  02,98   x2-rt br bx   COLONOSCOPY      FAMILY HISTORY: The patient family history includes Cancer in her father, maternal aunt, mother, paternal grandfather, and paternal uncle.  SOCIAL HISTORY:  The patient  reports that she has never smoked. She has never used smokeless tobacco. She reports that she does not drink alcohol and does not use drugs.  REVIEW OF SYSTEMS: Review of Systems  Cardiovascular:  Negative for chest pain, dyspnea on exertion, leg swelling, near-syncope, orthopnea, palpitations and syncope.    PHYSICAL EXAM:    04/22/2022    2:28 PM 09/16/2021    1:28 PM 03/18/2021    1:20 PM  Vitals with BMI  Height '5\' 7"'$  '5\' 7"'$  '5\' 7"'$   Weight 116 lbs 116 lbs 13 oz 129 lbs 13 oz  BMI  18.16 50.03 70.48  Systolic 889 169 450  Diastolic 91 90 91  Pulse 73 90 80   Physical Exam Cardiovascular:     Rate and Rhythm: Normal rate and regular rhythm.     Pulses: Normal pulses.     Heart sounds: Normal heart sounds. No murmur heard.    No gallop.  Pulmonary:     Effort: Pulmonary effort is normal.     Breath sounds: Normal breath sounds. No wheezing or rales.  Musculoskeletal:     Right lower leg: No edema.     Left lower leg: No edema.  Neurological:     Mental Status: She is alert.    CARDIAC DATABASE: EKG 04/22/2022: Normal sinus rhythm at rate of 82 bpm.  Normal axis.  Left atrial  enlargement.  No evidence of ischemia or underlying injury pattern.  Compared to previous EKG on 09/16/2021, no significant change.  Echocardiogram: 12/18/2020: Left ventricle cavity is normal in size and wall thickness. Normal global wall motion. Normal LV systolic function with EF 60%. Normal diastolic filling pattern. Structurally normal tricuspid valve. No evidence of tricuspid stenosis. Mild tricuspid regurgitation. No evidence of pulmonary hypertension.   Stress Testing: Exercise Myoview stress test 12/17/2020: 1 Day Rest/Stress Protocol. Patient exercised for 3 minutes and 51 seconds on Bruce protocol, achieved 5.67 METS, and 86% of maximum predicted heart rate. Stress ECG negative for ischemia Normal myocardial perfusion without convincing evidence of reversible ischemia or prior infarct. LVEF per gated SPECT 77% and preserved wall thickness without regional wall motion abnormalities. Low risk study.  Heart Catheterization: None   14 day extended Holter monitor: Dominant rhythm normal sinus rhythm. Heart rate 50-203 bpm.  Avg HR 81 bpm. No atrial fibrillation, NSVT, high grade AV block, pauses (3 seconds or longer). Total ventricular ectopic burden <1%. Total supraventricular ectopic burden <1%. Episodes of supraventricular and atrial tachycardia with the fastest interval lasting 7 beats at a max HR 203 bpm. Patient triggered events: 0.  LABORATORY DATA:    Latest Ref Rng & Units 02/23/2012    1:14 PM 02/18/2012    4:00 PM  CBC  WBC 4.0 - 10.5 K/uL  4.5   Hemoglobin 12.0 - 15.0 g/dL 14.6  12.9   Hematocrit 36.0 - 46.0 %  36.9   Platelets 150 - 400 K/uL  217        Latest Ref Rng & Units 03/13/2022   11:12 AM 09/12/2021   10:37 AM 02/18/2012    4:00 PM  CMP  Glucose 70 - 99 mg/dL 105  97  89   BUN 8 - 27 mg/dL '20  18  20   '$ Creatinine 0.57 - 1.00 mg/dL 0.82  0.88  0.78   Sodium 134 - 144 mmol/L 143  138  139   Potassium 3.5 - 5.2 mmol/L 4.9  4.5  4.5   Chloride 96 -  106 mmol/L 102  99  102   CO2 20 - 29 mmol/L '24  24  29   '$ Calcium 8.7 - 10.3 mg/dL 9.7  9.6  10.2   Total Protein 6.0 - 8.5 g/dL 7.1  6.6  7.2   Total Bilirubin 0.0 - 1.2 mg/dL 0.6  0.6  0.5   Alkaline Phos 44 - 121 IU/L 79  75  69   AST 0 - 40 IU/L '22  19  25   '$ ALT 0 - 32 IU/L '19  15  23     '$ Lipid Panel  Component Value Date/Time   CHOL 185 03/13/2022 1113   TRIG 47 03/13/2022 1113   HDL 98 03/13/2022 1113   LDLCALC 78 03/13/2022 1113   LDLDIRECT 78 03/13/2022 1113   LABVLDL 9 03/13/2022 1113   BMP Recent Labs    09/12/21 1037 03/13/22 1112  NA 138 143  K 4.5 4.9  CL 99 102  CO2 24 24  GLUCOSE 97 105*  BUN 18 20  CREATININE 0.88 0.82  CALCIUM 9.6 9.7   External Labs: Collected: 10/31/2020. Hemoglobin 13.3 g/dL, hematocrit 38.6% TSH 2.75. Sodium 139, potassium 4.8, chloride 102, bicarb 31, BUN 17, creatinine 0.71  Lipid profile: Collected: 12/17/2020  Total cholesterol 286, triglycerides 100, HDL 69, direct LDL 198, non-HDL 217.    IMPRESSION:    ICD-10-CM   1. Benign hypertension  I10     2. Pure hypercholesterolemia  E78.00     3. SVT (supraventricular tachycardia)  I47.10 EKG 12-Lead      RECOMMENDATIONS: BHAVYA ESCHETE is a 76 y.o. female whose past medical history and cardiac risk factors include: hyperlipidemia, anxiety, postmenopausal female.  Benign hypertension Patient's office blood pressures have been above normal limits at previous visits and it was suspected she had a degree of whitecoat hypertension. Last office visit she was asked to monitor home blood pressure at least twice a day and readings were well controlled. Office blood pressure is elevated today. At this time she is on metoprolol for palpitations but no other antihypertensive medications.  She brings with her an extensive log of home blood pressure readings that are overall well controlled. We discussed stress reduction measures including meditation.  She continues to be very  active and is following a low-sodium diet.  Pure hypercholesterolemia Prior to establishing care LDL levels will be 198 mg/dL. She continues on Crestor 10 mg daily without myalgias. Reviewed recent lab results and lipids are under good control.  Palpitations / SVT (supraventricular tachycardia) ([place]) Remains asymptomatic since initiation of Toprol-XL 25 mg p.o. daily. Monitor peripherally.   FINAL MEDICATION LIST END OF ENCOUNTER: No orders of the defined types were placed in this encounter.    There are no discontinued medications.     Current Outpatient Medications:    Calcium Citrate-Vitamin D (CALCIUM CITRATE + D PO), Take by mouth., Disp: , Rfl:    fish oil-omega-3 fatty acids 1000 MG capsule, Take 2 g by mouth 2 (two) times daily., Disp: , Rfl:    Flaxseed, Linseed, (FLAXSEED OIL PO), Take by mouth., Disp: , Rfl:    metoprolol succinate (TOPROL-XL) 25 MG 24 hr tablet, TAKE 1 TABLET BY MOUTH IN THE MORNING, Disp: 90 tablet, Rfl: 0   Multiple Vitamins-Minerals (PRESERVISION AREDS) CAPS, Take 1 capsule by mouth daily., Disp: , Rfl:    rosuvastatin (CRESTOR) 10 MG tablet, TAKE 1 TABLET BY MOUTH AT BEDTIME, Disp: 90 tablet, Rfl: 0   vitamin E 180 MG (400 UNITS) capsule, Take 400 Units by mouth daily., Disp: , Rfl:    Glucosamine-Chondroit-Vit C-Mn (GLUCOSAMINE 1500 COMPLEX PO), Take by mouth. (Patient not taking: Reported on 04/22/2022), Disp: , Rfl:   Orders Placed This Encounter  Procedures   EKG 12-Lead     There are no Patient Instructions on file for this visit.   --Continue cardiac medications as reconciled in final medication list. --Return in about 6 months (around 10/21/2022) for HLD, HTN, PVC. Or sooner if needed. --Continue follow-up with your primary care physician regarding the management of your other chronic comorbid conditions.  Patient's questions and concerns were addressed to her satisfaction. She voices understanding of the instructions provided during  this encounter.   This note was created using a voice recognition software as a result there may be grammatical errors inadvertently enclosed that do not reflect the nature of this encounter. Every attempt is made to correct such errors.    Ernst Spell, [place] Pager: [phone] Office: [phone]

## 2022-07-03 ENCOUNTER — Other Ambulatory Visit: Payer: Self-pay | Admitting: Cardiology

## 2022-07-03 DIAGNOSIS — R002 Palpitations: Secondary | ICD-10-CM

## 2022-07-03 DIAGNOSIS — E7800 Pure hypercholesterolemia, unspecified: Secondary | ICD-10-CM

## 2022-09-27 IMAGING — US US THYROID
1 series · 13 of 25 positions shown · non-contrast
Comparison: None.

CLINICAL DATA: Palpable abnormality.

EXAM:
THYROID ULTRASOUND
TECHNIQUE: Ultrasound examination of the thyroid gland and adjacent soft
tissues was performed.

[Series 1: us thyroid · 0.04mm/px · 13 of 56 slices shown]
[im 1/56]
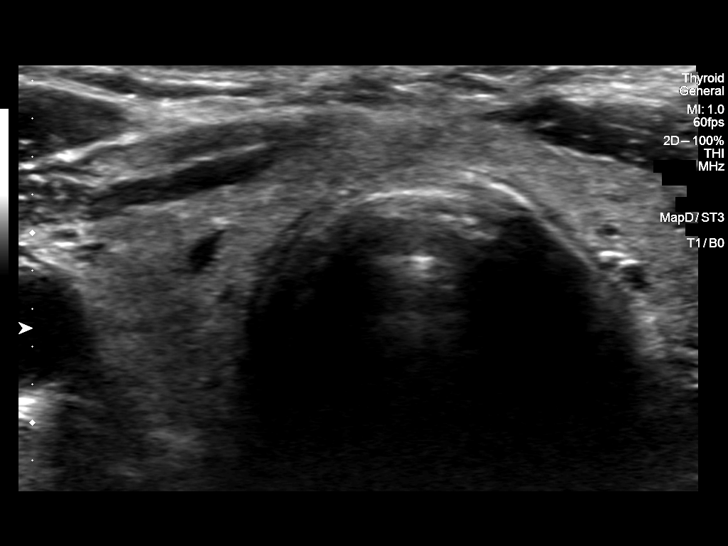
[im 5/56]
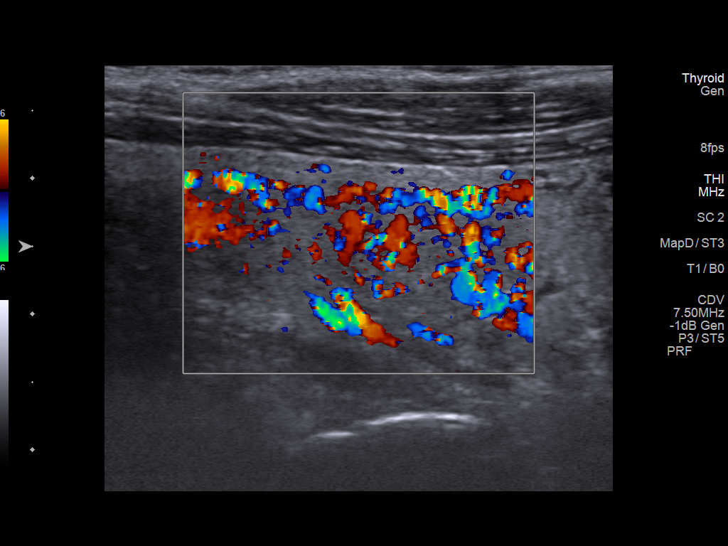
[im 10/56]
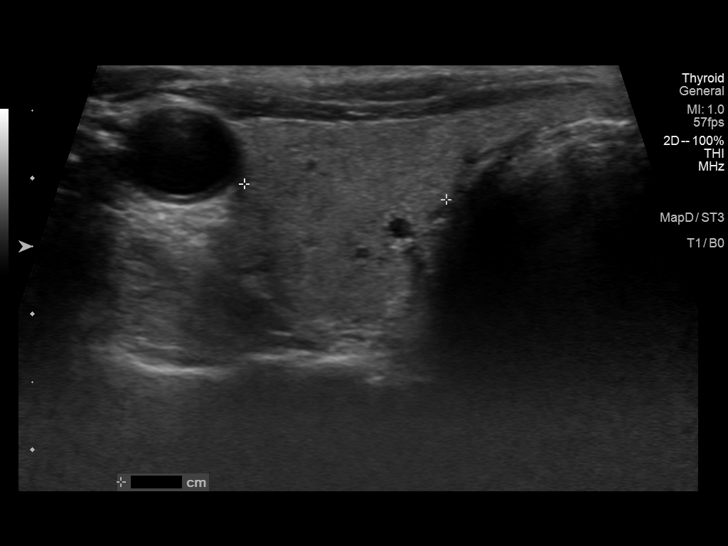
[im 14/56]
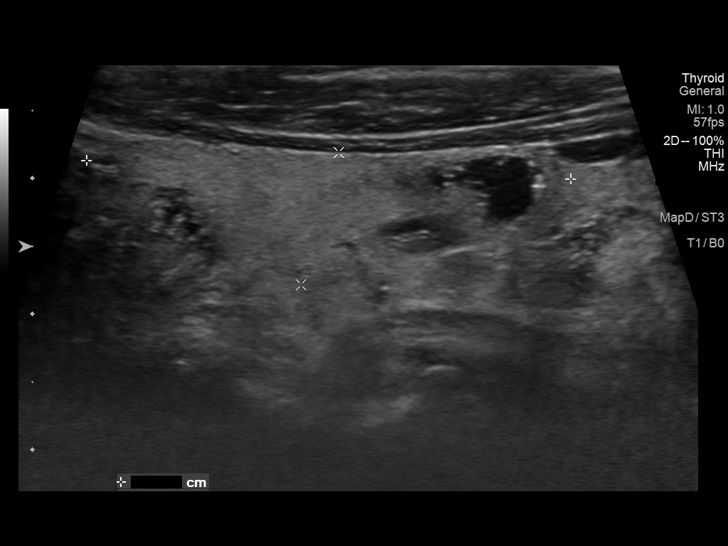
[im 19/56]
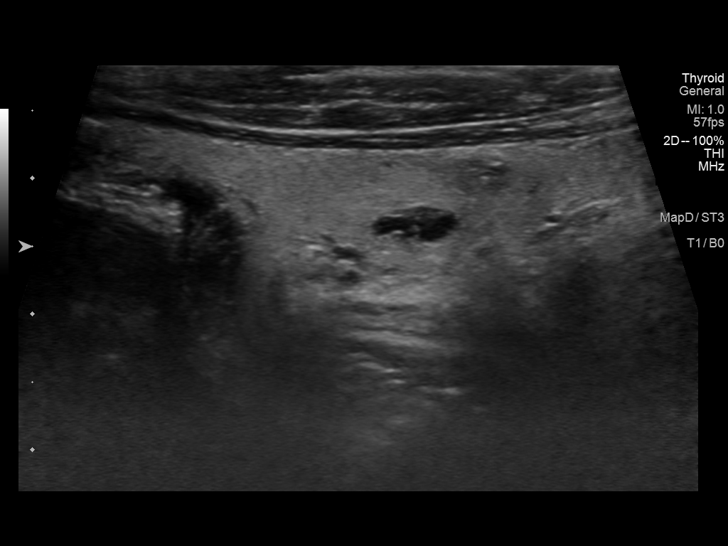
[im 23/56]
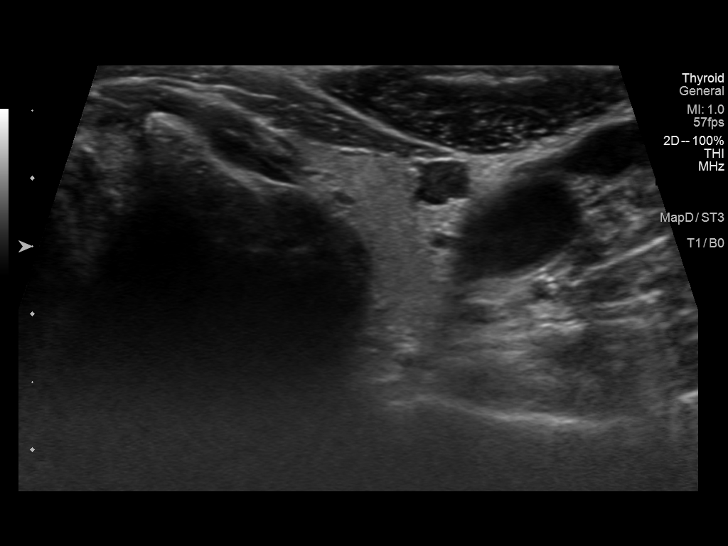
[im 28/56]
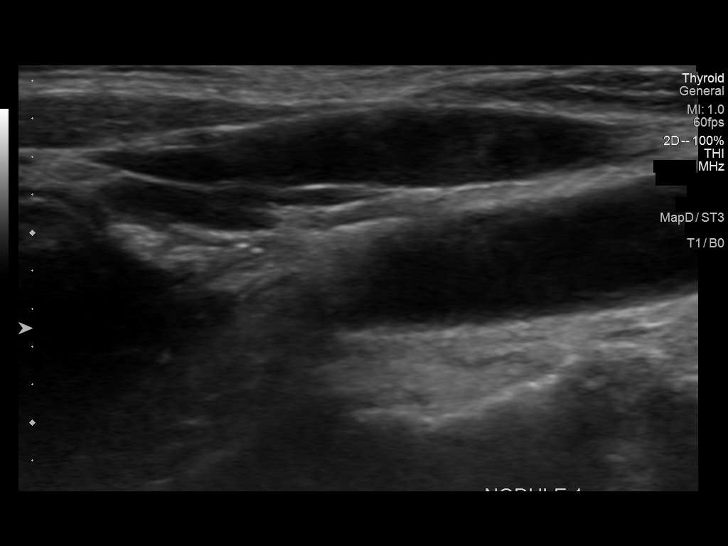
[im 33/56]
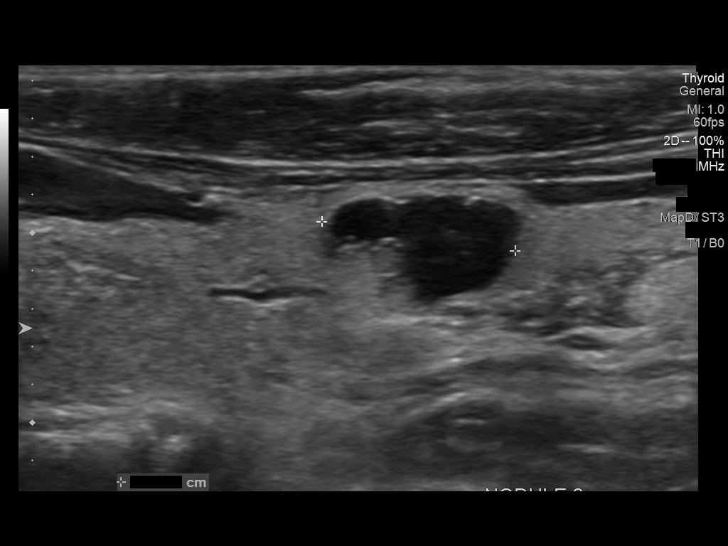
[im 37/56]
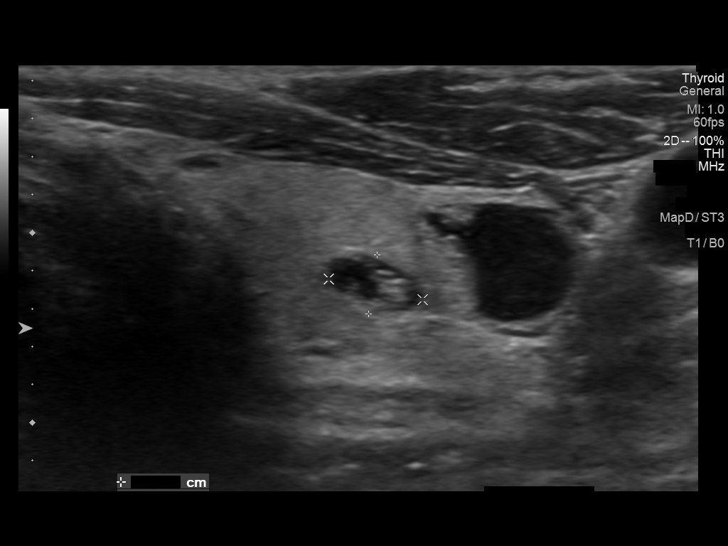
[im 42/56]
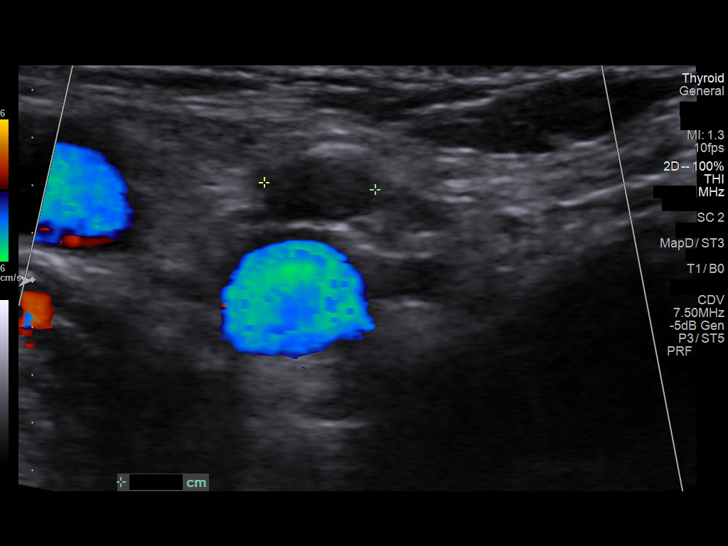
[im 46/56]
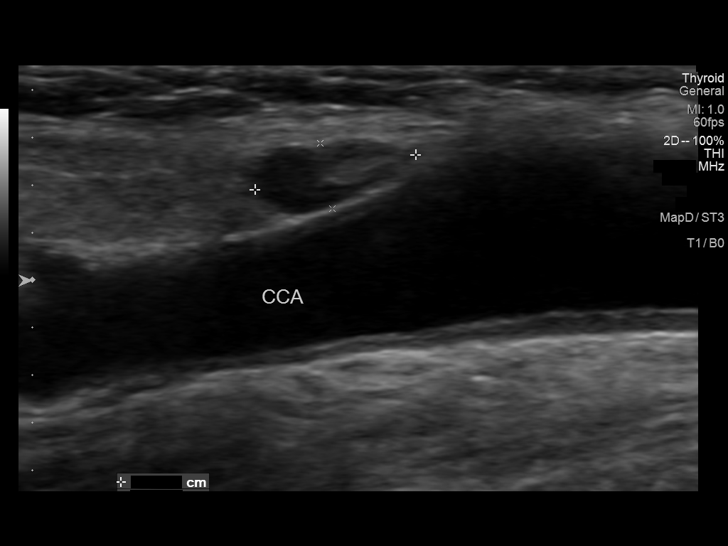
[im 51/56]
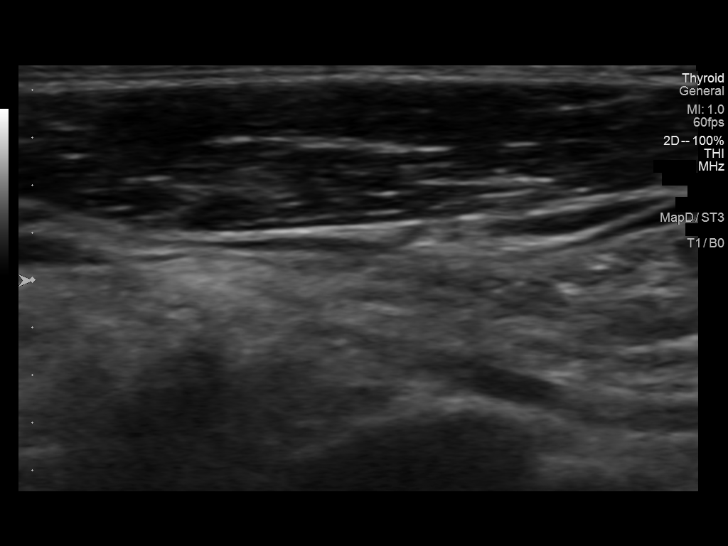
[im 56/56]
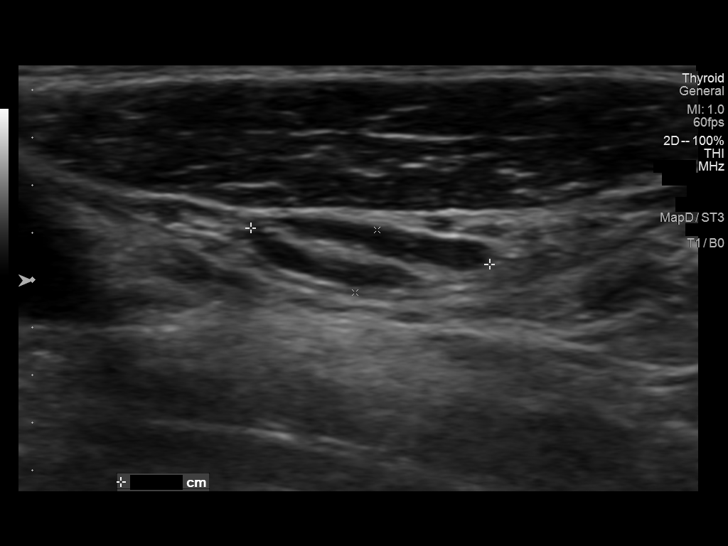

[13 of 25 positions shown; findings below may reference images not displayed]

FINDINGS: Parenchymal Echotexture: Mildly heterogenous

Isthmus: 0.3 cm

Right lobe: 3.9 x 1.4 x 1.5 cm

Left lobe: 3.6 x 1.0 x 1.5 cm

_________________________________________________________

Estimated total number of nodules >/= 1 cm: 2

Number of spongiform nodules >/=  2 cm not described below (TR1): 0

Number of mixed cystic and solid nodules >/= 1.5 cm not described
below (TR2): 0

_________________________________________________________

Nodule labeled 1 is an elongated anechoic cystic nodule in the left
thyroid lobe measuring 2.1 x 0.5 x 0.4 cm. This nodule does NOT meet
TI-RADS criteria for biopsy or dedicated follow-up.

Nodule # 2:

Location: Left; Inferior

Maximum size: 1.0 cm; Other 2 dimensions: 0.8 x 0.7 cm

Composition: mixed cystic and solid (1) (eccentric isoechoic solid
component)

Echogenicity: isoechoic (1)

Shape: not taller-than-wide (0)

Margins: smooth (0)

Echogenic foci: none (0)

ACR TI-RADS total points: 2.

ACR TI-RADS risk category: TR2 (2 points).

ACR TI-RADS recommendations:

This nodule does NOT meet TI-RADS criteria for biopsy or dedicated
follow-up.

_________________________________________________________

Nodule labeled 3 is a small subcentimeter spongiform nodule in the
inferior left thyroid lobe. This nodule does NOT meet TI-RADS
criteria for biopsy or dedicated follow-up.

Several normal-appearing cervical lymph nodes are identified on
exam.
IMPRESSION: 1. Multinodular thyroid gland with multiple benign appearing
left-sided thyroid nodules. No nodules identified on today's exam
meet criteria for further dedicated follow-up or biopsy.
2. No findings on thyroid ultrasound to explain right-sided
symptoms.

The above is in keeping with the ACR TI-RADS recommendations - [HOSPITAL] 4479;[DATE].

## 2022-10-14 ENCOUNTER — Other Ambulatory Visit: Payer: Self-pay

## 2022-10-14 DIAGNOSIS — E7800 Pure hypercholesterolemia, unspecified: Secondary | ICD-10-CM

## 2022-10-18 LAB — COMPREHENSIVE METABOLIC PANEL
ALT: 21 IU/L (ref 0–32)
AST: 21 IU/L (ref 0–40)
Albumin/Globulin Ratio: 2.3 — ABNORMAL HIGH (ref 1.2–2.2)
Albumin: 4.4 g/dL (ref 3.8–4.8)
Alkaline Phosphatase: 73 IU/L (ref 44–121)
BUN/Creatinine Ratio: 22 (ref 12–28)
BUN: 18 mg/dL (ref 8–27)
Bilirubin Total: 0.6 mg/dL (ref 0.0–1.2)
CO2: 24 mmol/L (ref 20–29)
Calcium: 9.2 mg/dL (ref 8.7–10.3)
Chloride: 102 mmol/L (ref 96–106)
Creatinine, Ser: 0.81 mg/dL (ref 0.57–1.00)
Globulin, Total: 1.9 g/dL (ref 1.5–4.5)
Glucose: 94 mg/dL (ref 70–99)
Potassium: 4.4 mmol/L (ref 3.5–5.2)
Sodium: 140 mmol/L (ref 134–144)
Total Protein: 6.3 g/dL (ref 6.0–8.5)
eGFR: 75 mL/min/{1.73_m2} (ref >59)

## 2022-10-18 LAB — LIPID PANEL WITH LDL/HDL RATIO
Cholesterol, Total: 173 mg/dL (ref 100–199)
HDL: 88 mg/dL (ref >39)
LDL Chol Calc (NIH): 75 mg/dL (ref 0–99)
LDL/HDL Ratio: 0.9 ratio (ref 0.0–3.2)
Triglycerides: 49 mg/dL (ref 0–149)
VLDL Cholesterol Cal: 10 mg/dL (ref 5–40)

## 2022-10-18 LAB — LDL CHOLESTEROL, DIRECT: LDL Direct: 79 mg/dL (ref 0–99)

## 2022-10-21 ENCOUNTER — Ambulatory Visit: Payer: Medicare Other

## 2022-10-21 ENCOUNTER — Encounter: Payer: Self-pay | Admitting: Internal Medicine

## 2022-10-21 ENCOUNTER — Ambulatory Visit: Payer: Medicare Other | Admitting: Internal Medicine

## 2022-10-21 VITALS — BP 152/83 | HR 85 | Ht 67.0 in | Wt 122.2 lb

## 2022-10-21 DIAGNOSIS — I10 Essential (primary) hypertension: Secondary | ICD-10-CM

## 2022-10-21 DIAGNOSIS — E7800 Pure hypercholesterolemia, unspecified: Secondary | ICD-10-CM

## 2022-10-21 DIAGNOSIS — R002 Palpitations: Secondary | ICD-10-CM

## 2022-10-21 NOTE — Progress Notes (Signed)
ID:  Sheryl Mejia, DOB 1945/07/03, MRN [id number]  PCP:  Trisha Mangle, FNP  Cardiologist:  Tessa Lerner, DO, [facility] (established care 11/27/2020)  Date: 10/21/22 Last Office Visit: 03/18/2021  Chief Complaint  Patient presents with   Hyperlipidemia   Follow-up    HPI  Sheryl Mejia is a 77 y.o. female whose past medical history and cardiovascular risk factors include: History of hyperlipidemia currently not on statin therapy, anxiety, postmenopausal female.  Presents for 86-month follow-up for palpitations and hyperlipidemia management. She has been doing well since the last time she was here. She just recently had her cholesterol checked and it was beautifully controlled. She brings an extensive blood pressure log every time she comes here since she has pretty severe white coat hypertension. She takes her blood pressure three times daily with two different cuffs and all of her numbers are <130/80 at home. Denies chest pain, shortness of breath, palpitations, diaphoresis, syncope, edema, PND, orthopnea.      FUNCTIONAL STATUS: No structured exercise program or daily routine.    ALLERGIES: No Known Allergies  MEDICATION LIST PRIOR TO VISIT: Current Meds  Medication Sig   Calcium Citrate-Vitamin D (CALCIUM CITRATE + D PO) Take by mouth.   cholecalciferol (VITAMIN D3) 25 MCG (1000 UNIT) tablet Take 1,000 Units by mouth daily.   fish oil-omega-3 fatty acids 1000 MG capsule Take 2 g by mouth 2 (two) times daily.   Flaxseed, Linseed, (FLAXSEED OIL PO) Take by mouth.   Multiple Vitamins-Minerals (MULTIVITAMIN WITH MINERALS) tablet Take 1 tablet by mouth.   Multiple Vitamins-Minerals (PRESERVISION AREDS) CAPS Take 1 capsule by mouth daily.   rosuvastatin (CRESTOR) 10 MG tablet TAKE 1 TABLET BY MOUTH AT BEDTIME   vitamin E 180 MG (400 UNITS) capsule Take 400 Units by mouth daily.     PAST MEDICAL HISTORY: Past Medical History:  Diagnosis Date   Hyperlipidemia    No  pertinent past medical history    Osteoporosis    SVT (supraventricular tachycardia)     PAST SURGICAL HISTORY: Past Surgical History:  Procedure Laterality Date   ANKLE SURGERY  2001   lt ankle-orif   BONY PELVIS SURGERY  2001   lt hip orif   BREAST SURGERY  02,98   x2-rt br bx   COLONOSCOPY      FAMILY HISTORY: The patient family history includes Cancer in her father, maternal aunt, mother, paternal grandfather, and paternal uncle.  SOCIAL HISTORY:  The patient  reports that she has never smoked. She has never used smokeless tobacco. She reports that she does not drink alcohol and does not use drugs.  REVIEW OF SYSTEMS: Review of Systems  Cardiovascular:  Negative for chest pain, dyspnea on exertion, leg swelling, near-syncope, orthopnea, palpitations and syncope.    PHYSICAL EXAM:    10/21/2022   12:57 PM 04/22/2022    2:28 PM 09/16/2021    1:28 PM  Vitals with BMI  Height 5\' 7"  5\' 7"  5\' 7"   Weight 122 lbs 3 oz 116 lbs 116 lbs 13 oz  BMI 19.13 18.16 18.29  Systolic 152 164 409  Diastolic 83 91 90  Pulse 85 73 90   Physical Exam Cardiovascular:     Rate and Rhythm: Normal rate and regular rhythm.     Pulses: Normal pulses.     Heart sounds: Normal heart sounds. No murmur heard.    No gallop.  Pulmonary:     Effort: Pulmonary effort is normal.  Breath sounds: Normal breath sounds. No wheezing or rales.  Musculoskeletal:     Right lower leg: No edema.     Left lower leg: No edema.  Neurological:     Mental Status: She is alert.    CARDIAC DATABASE: EKG 04/22/2022: Normal sinus rhythm at rate of 82 bpm.  Normal axis.  Left atrial enlargement.  No evidence of ischemia or underlying injury pattern.  Compared to previous EKG on 09/16/2021, no significant change.  Echocardiogram: 12/18/2020: Left ventricle cavity is normal in size and wall thickness. Normal global wall motion. Normal LV systolic function with EF 60%. Normal diastolic filling  pattern. Structurally normal tricuspid valve. No evidence of tricuspid stenosis. Mild tricuspid regurgitation. No evidence of pulmonary hypertension.   Stress Testing: Exercise Myoview stress test 12/17/2020: 1 Day Rest/Stress Protocol. Patient exercised for 3 minutes and 51 seconds on Bruce protocol, achieved 5.67 METS, and 86% of maximum predicted heart rate. Stress ECG negative for ischemia Normal myocardial perfusion without convincing evidence of reversible ischemia or prior infarct. LVEF per gated SPECT 77% and preserved wall thickness without regional wall motion abnormalities. Low risk study.  Heart Catheterization: None   14 day extended Holter monitor: Dominant rhythm normal sinus rhythm. Heart rate 50-203 bpm.  Avg HR 81 bpm. No atrial fibrillation, NSVT, high grade AV block, pauses (3 seconds or longer). Total ventricular ectopic burden <1%. Total supraventricular ectopic burden <1%. Episodes of supraventricular and atrial tachycardia with the fastest interval lasting 7 beats at a max HR 203 bpm. Patient triggered events: 0.  LABORATORY DATA:    Latest Ref Rng & Units 02/23/2012    1:14 PM 02/18/2012    4:00 PM  CBC  WBC 4.0 - 10.5 K/uL  4.5   Hemoglobin 12.0 - 15.0 g/dL 16.1  09.6   Hematocrit 36.0 - 46.0 %  36.9   Platelets 150 - 400 K/uL  217        Latest Ref Rng & Units 10/17/2022   11:06 AM 03/13/2022   11:12 AM 09/12/2021   10:37 AM  CMP  Glucose 70 - 99 mg/dL 94  045  97   BUN 8 - 27 mg/dL 18  20  18    Creatinine 0.57 - 1.00 mg/dL 4.09  8.11  9.14   Sodium 134 - 144 mmol/L 140  143  138   Potassium 3.5 - 5.2 mmol/L 4.4  4.9  4.5   Chloride 96 - 106 mmol/L 102  102  99   CO2 20 - 29 mmol/L 24  24  24    Calcium 8.7 - 10.3 mg/dL 9.2  9.7  9.6   Total Protein 6.0 - 8.5 g/dL 6.3  7.1  6.6   Total Bilirubin 0.0 - 1.2 mg/dL 0.6  0.6  0.6   Alkaline Phos 44 - 121 IU/L 73  79  75   AST 0 - 40 IU/L 21  22  19    ALT 0 - 32 IU/L 21  19  15      Lipid Panel      Component Value Date/Time   CHOL 173 10/17/2022 1106   TRIG 49 10/17/2022 1106   HDL 88 10/17/2022 1106   LDLCALC 75 10/17/2022 1106   LDLDIRECT 79 10/17/2022 1106   LABVLDL 10 10/17/2022 1106   BMP Recent Labs    03/13/22 1112 10/17/22 1106  NA 143 140  K 4.9 4.4  CL 102 102  CO2 24 24  GLUCOSE 105* 94  BUN 20 18  CREATININE 0.82 0.81  CALCIUM 9.7 9.2   External Labs: Collected: 10/31/2020. Hemoglobin 13.3 g/dL, hematocrit 16.1% TSH 0.96. Sodium 139, potassium 4.8, chloride 102, bicarb 31, BUN 17, creatinine 0.71  Lipid profile: Collected: 12/17/2020  Total cholesterol 286, triglycerides 100, HDL 69, direct LDL 198, non-HDL 217.    IMPRESSION:    ICD-10-CM   1. Palpitations  R00.2     2. Pure hypercholesterolemia  E78.00     3. Benign hypertension  I10        RECOMMENDATIONS: Sheryl Mejia is a 77 y.o. female whose past medical history and cardiac risk factors include: hyperlipidemia, anxiety, postmenopausal female.  Benign hypertension She keeps a log of her BP which she takes morning, noon, and nighttime and she is always <130/80 She does have severe white coat hypertension and her pressure is always up when she goes to the doctor Her extensive BP log shows that she does not have high blood pressures outside of here Encourage low-sodium diet, less than 2000 mg daily.   Pure hypercholesterolemia She continues on Crestor 10 mg daily without myalgias. Her most recent LDL was 75 and cholesterol is overall very well controlled   Palpitations / SVT (supraventricular tachycardia) ([facility]) No palpitations since last visit   FINAL MEDICATION LIST END OF ENCOUNTER: No orders of the defined types were placed in this encounter.    Medications Discontinued During This Encounter  Medication Reason   Glucosamine-Chondroit-Vit C-Mn (GLUCOSAMINE 1500 COMPLEX PO) Patient Preference       Current Outpatient Medications:    Calcium Citrate-Vitamin D (CALCIUM  CITRATE + D PO), Take by mouth., Disp: , Rfl:    cholecalciferol (VITAMIN D3) 25 MCG (1000 UNIT) tablet, Take 1,000 Units by mouth daily., Disp: , Rfl:    fish oil-omega-3 fatty acids 1000 MG capsule, Take 2 g by mouth 2 (two) times daily., Disp: , Rfl:    Flaxseed, Linseed, (FLAXSEED OIL PO), Take by mouth., Disp: , Rfl:    Multiple Vitamins-Minerals (MULTIVITAMIN WITH MINERALS) tablet, Take 1 tablet by mouth., Disp: , Rfl:    Multiple Vitamins-Minerals (PRESERVISION AREDS) CAPS, Take 1 capsule by mouth daily., Disp: , Rfl:    rosuvastatin (CRESTOR) 10 MG tablet, TAKE 1 TABLET BY MOUTH AT BEDTIME, Disp: 90 tablet, Rfl: 3   vitamin E 180 MG (400 UNITS) capsule, Take 400 Units by mouth daily., Disp: , Rfl:    metoprolol succinate (TOPROL-XL) 25 MG 24 hr tablet, TAKE 1 TABLET BY MOUTH IN THE MORNING (Patient not taking: Reported on 10/21/2022), Disp: 90 tablet, Rfl: 3  No orders of the defined types were placed in this encounter.       Clotilde Dieter, DO Pager: [phone] Office: [phone]

## 2022-10-22 ENCOUNTER — Other Ambulatory Visit: Payer: Self-pay | Admitting: Cardiology

## 2022-10-22 DIAGNOSIS — E7800 Pure hypercholesterolemia, unspecified: Secondary | ICD-10-CM

## 2022-10-22 MED ORDER — ROSUVASTATIN CALCIUM 20 MG PO TABS: 20.0000 mg | ORAL_TABLET | Freq: Every day | ORAL | 3 refills | Status: DC

## 2023-04-23 ENCOUNTER — Telehealth: Payer: Self-pay | Admitting: Cardiology

## 2023-04-23 ENCOUNTER — Ambulatory Visit: Payer: Self-pay | Admitting: Cardiology

## 2023-04-23 NOTE — Telephone Encounter (Signed)
Spoke with pt. Pt would like to have labs drawn before office visit on 05/04/23 so that results can be reviewed at visit. Answered questions about our office and lab information.  Pt is requesting call back to  confirm labs and if it is better to come to our lab.

## 2023-04-23 NOTE — Telephone Encounter (Signed)
Patient would like to know if Dr. Odis Hollingshead would like her to have labs drawn prior to 11/25 appointment. I informed her there are lab orders and the system, but she states she requested a call regarding this matter prior to Dr. Emelda Brothers transition to [facility], and she would like clarification directly from DO.

## 2023-04-24 ENCOUNTER — Other Ambulatory Visit: Payer: Self-pay

## 2023-04-24 DIAGNOSIS — R002 Palpitations: Secondary | ICD-10-CM

## 2023-04-24 DIAGNOSIS — E7800 Pure hypercholesterolemia, unspecified: Secondary | ICD-10-CM

## 2023-04-24 DIAGNOSIS — I471 Supraventricular tachycardia, unspecified: Secondary | ICD-10-CM

## 2023-04-24 NOTE — Progress Notes (Unsigned)
Labs ordered and released. 

## 2023-04-24 NOTE — Telephone Encounter (Signed)
Labs ordered and released. Pt called and was told to come fasted and try to come at the beginning of next week labs so that they are back in time for her appointment. Pt agreed with plan.

## 2023-04-24 NOTE — Telephone Encounter (Signed)
Fasting lipids, CMP, Lpa Have her come in sooner as Lpa takes longer time to come back .  Sheryl Bihl [place], DO, [facility]

## 2023-04-29 LAB — LIPID PANEL
Chol/HDL Ratio: 2 ratio (ref 0.0–4.4)
Cholesterol, Total: 168 mg/dL (ref 100–199)
HDL: 83 mg/dL (ref >39)
LDL Chol Calc (NIH): 74 mg/dL (ref 0–99)
Triglycerides: 57 mg/dL (ref 0–149)
VLDL Cholesterol Cal: 11 mg/dL (ref 5–40)

## 2023-04-29 LAB — COMPREHENSIVE METABOLIC PANEL
ALT: 18 [IU]/L (ref 0–32)
AST: 20 [IU]/L (ref 0–40)
Albumin: 4.7 g/dL (ref 3.8–4.8)
Alkaline Phosphatase: 74 [IU]/L (ref 44–121)
BUN/Creatinine Ratio: 25 (ref 12–28)
BUN: 20 mg/dL (ref 8–27)
Bilirubin Total: 0.5 mg/dL (ref 0.0–1.2)
CO2: 27 mmol/L (ref 20–29)
Calcium: 9.3 mg/dL (ref 8.7–10.3)
Chloride: 102 mmol/L (ref 96–106)
Creatinine, Ser: 0.8 mg/dL (ref 0.57–1.00)
Globulin, Total: 1.8 g/dL (ref 1.5–4.5)
Glucose: 91 mg/dL (ref 70–99)
Potassium: 4.2 mmol/L (ref 3.5–5.2)
Sodium: 140 mmol/L (ref 134–144)
Total Protein: 6.5 g/dL (ref 6.0–8.5)
eGFR: 75 mL/min/{1.73_m2} (ref >59)

## 2023-04-29 LAB — LIPOPROTEIN A (LPA): Lipoprotein (a): 28.7 nmol/L (ref <75.0)

## 2023-05-04 ENCOUNTER — Ambulatory Visit: Payer: Medicare Other | Attending: Cardiology | Admitting: Cardiology

## 2023-05-04 ENCOUNTER — Encounter: Payer: Self-pay | Admitting: Cardiology

## 2023-05-04 VITALS — BP 160/78 | HR 84 | Ht 67.0 in | Wt 120.6 lb

## 2023-05-04 DIAGNOSIS — I471 Supraventricular tachycardia, unspecified: Secondary | ICD-10-CM

## 2023-05-04 DIAGNOSIS — R002 Palpitations: Secondary | ICD-10-CM | POA: Diagnosis not present

## 2023-05-04 DIAGNOSIS — I10 Essential (primary) hypertension: Secondary | ICD-10-CM | POA: Diagnosis not present

## 2023-05-04 DIAGNOSIS — E7800 Pure hypercholesterolemia, unspecified: Secondary | ICD-10-CM

## 2023-05-04 NOTE — Progress Notes (Signed)
Cardiology Office Note:  .   Date:  05/04/2023  ID:  Sheryl Mejia, DOB 1946/05/07, MRN [id number] PCP:  Trisha Mangle, FNP  Former Cardiology Providers: Dr. Clotilde Dieter [facility] Providers Cardiologist:  Tessa Lerner, DO , [facility] (established care 11/2020) Electrophysiologist:  None  Click to update primary MD,subspecialty MD or APP then REFRESH:1}    Chief Complaint  Patient presents with   Follow-up    HLD and palpitations    History of Present Illness: .   Sheryl Mejia is a 77 y.o. Caucasian female whose past medical history and cardiovascular risk factors includes: Hyperlipidemia, hypertension, anxiety, postmenopausal female.  Patient was referred to the practice for management of palpitations and hyperlipidemia.  Hyperlipidemia: LDL levels in the past were as high as 198 mg/dL with up titration of medical therapy her LDL levels have improved.  She is tolerating rosuvastatin well and her recent  lipid profile from November 2024 notes  LDL 74 mg/dL. She brought in new article that Crestor could be associated with glucoma. But her eye exams have not noted findigs on glaucoma.  Palpitations/PSVT: In the past she also had episodes of palpitations and underwent a cardiac monitor.  She was noted to have episodes of PSVT and was started on Toprol-XL and since then she has remained asymptomatic.  However, since last office visit episodes of palpitations likely brought on by increased stress/worrying/anxiety related to elections and "next four years."  Patient has a degree of whitecoat hypertension.  Her home blood pressures are usually well-controlled.  According to her blood pressure SBP at home ranges between 120-130 mmHg.   Review of Systems: .   Review of Systems  Cardiovascular:  Negative for chest pain, claudication, irregular heartbeat, leg swelling, near-syncope, orthopnea, palpitations, paroxysmal nocturnal dyspnea and syncope.  Respiratory:  Negative for  shortness of breath.   Hematologic/Lymphatic: Negative for bleeding problem.    Studies Reviewed:   EKG: EKG Interpretation Date/Time:  Monday May 04 2023 13:07:32 EST Text Interpretation: Normal sinus rhythm Possible Left atrial enlargement Septal infarct , age undetermined No previous ECGs available Confirmed by Tessa Lerner [phone]) on 05/04/2023 1:12:55 PM  Echocardiogram: 12/18/2020: Normal LV systolic function with EF 60%. Normal diastolic filling pattern. Mild tricuspid regurgitation. See report for additional details  Stress Testing: Exercise Myoview stress test 12/17/2020: 1 Day Rest/Stress Protocol.  Stress ECG negative for ischemia Normal myocardial perfusion without convincing evidence of reversible ischemia or prior infarct.  Low risk study See report for additional details  Cardiac monitor: 14 day extended Holter monitor: Dominant rhythm normal sinus rhythm. Heart rate 50-203 bpm.  Avg HR 81 bpm. No atrial fibrillation, NSVT, high grade AV block, pauses (3 seconds or longer). Total ventricular ectopic burden <1%. Total supraventricular ectopic burden <1%. Episodes of supraventricular and atrial tachycardia with the fastest interval lasting 7 beats at a max HR 203 bpm. Patient triggered events: 0.  RADIOLOGY: N/A  Risk Assessment/Calculations:   N/A   Labs:       Latest Ref Rng & Units 02/23/2012    1:14 PM 02/18/2012    4:00 PM  CBC  WBC 4.0 - 10.5 K/uL  4.5   Hemoglobin 12.0 - 15.0 g/dL 84.6  96.2   Hematocrit 36.0 - 46.0 %  36.9   Platelets 150 - 400 K/uL  217        Latest Ref Rng & Units 04/28/2023   10:53 AM 10/17/2022   11:06 AM 03/13/2022   11:12 AM  BMP  Glucose 70 - 99 mg/dL 91  94  540   BUN 8 - 27 mg/dL 20  18  20    Creatinine 0.57 - 1.00 mg/dL 9.81  1.91  4.78   BUN/Creat Ratio 12 - 28 25  22  24    Sodium 134 - 144 mmol/L 140  140  143   Potassium 3.5 - 5.2 mmol/L 4.2  4.4  4.9   Chloride 96 - 106 mmol/L 102  102  102   CO2  20 - 29 mmol/L 27  24  24    Calcium 8.7 - 10.3 mg/dL 9.3  9.2  9.7       Latest Ref Rng & Units 04/28/2023   10:53 AM 10/17/2022   11:06 AM 03/13/2022   11:12 AM  CMP  Glucose 70 - 99 mg/dL 91  94  295   BUN 8 - 27 mg/dL 20  18  20    Creatinine 0.57 - 1.00 mg/dL 6.21  3.08  6.57   Sodium 134 - 144 mmol/L 140  140  143   Potassium 3.5 - 5.2 mmol/L 4.2  4.4  4.9   Chloride 96 - 106 mmol/L 102  102  102   CO2 20 - 29 mmol/L 27  24  24    Calcium 8.7 - 10.3 mg/dL 9.3  9.2  9.7   Total Protein 6.0 - 8.5 g/dL 6.5  6.3  7.1   Total Bilirubin 0.0 - 1.2 mg/dL 0.5  0.6  0.6   Alkaline Phos 44 - 121 IU/L 74  73  79   AST 0 - 40 IU/L 20  21  22    ALT 0 - 32 IU/L 18  21  19      Lab Results  Component Value Date   CHOL 168 04/28/2023   HDL 83 04/28/2023   LDLCALC 74 04/28/2023   LDLDIRECT 79 10/17/2022   TRIG 57 04/28/2023   CHOLHDL 2.0 04/28/2023   Recent Labs    04/28/23 1053  LIPOA 28.7   No components found for: "NTPROBNP" No results for input(s): "PROBNP" in the last 8760 hours. No results for input(s): "TSH" in the last 8760 hours.  Physical Exam:    Today's Vitals   05/04/23 1303  BP: (!) 160/78  Pulse: 84  SpO2: 96%  Weight: 120 lb 9.6 oz (54.7 kg)  Height: 5\' 7"  (1.702 m)   Body mass index is 18.89 kg/m. Wt Readings from Last 3 Encounters:  05/04/23 120 lb 9.6 oz (54.7 kg)  10/21/22 122 lb 3.2 oz (55.4 kg)  04/22/22 116 lb (52.6 kg)    Physical Exam  Constitutional: No distress.  hemodynamically stable  Neck: No JVD present.  Cardiovascular: Normal rate, regular rhythm, S1 normal and S2 normal. Exam reveals no gallop, no S3 and no S4.  No murmur heard. Pulmonary/Chest: Effort normal and breath sounds normal. No stridor. She has no wheezes. She has no rales.  Abdominal: Soft. Bowel sounds are normal. She exhibits no distension. There is no abdominal tenderness.  Musculoskeletal:        General: No edema.     Cervical back: Neck supple.  Neurological: She  is alert and oriented to person, place, and time. She has intact cranial nerves (2-12).  Skin: Skin is warm.   Impression & Recommendation(s):  Impression:   ICD-10-CM   1. Benign hypertension  I10     2. Pure hypercholesterolemia  E78.00     3. Palpitations  R00.2 EKG 12-Lead  4. SVT (supraventricular tachycardia) (HCC)  I47.10        Recommendation(s):  Benign hypertension Office blood pressure are elevated. Home blood pressures are better controlled with SBP between 120-130 mmHg Currently on Toprol-XL 25 mg p.o. every morning. Reemphasized importance of low-salt diet.  Pure hypercholesterolemia LDL level 198 milligrams per deciliter in the past. Most recent lipid profile independently reviewed from November 2024, LDL is 74 mg/dL Continue Crestor 20 mg p.o. nightly.  Patient brought in newspaper article regarding crestor and its association with glaucoma. She states that she does not have glaucoma as of now.  We discussed changing it to another statin due to her concerns; however, she would like to hold off for now.   Palpitations SVT (supraventricular tachycardia) (HCC) Has done well with Toprol-XL 25 mg p.o. daily. Recently has noted increased frequency likely secondary to stress and anxiety She brought in a newspaper article regarding deep breathing exercises and how it would help her. I encouraged it.  However, she would feel much better if she focusing on stress/anxiety management.   Orders Placed:  Orders Placed This Encounter  Procedures   EKG 12-Lead    Final Medication List:   No orders of the defined types were placed in this encounter.   There are no discontinued medications.   Current Outpatient Medications:    Calcium Citrate-Vitamin D (CALCIUM CITRATE + D PO), Take by mouth., Disp: , Rfl:    cholecalciferol (VITAMIN D3) 25 MCG (1000 UNIT) tablet, Take 1,000 Units by mouth daily., Disp: , Rfl:    fish oil-omega-3 fatty acids 1000 MG capsule, Take 2 g  by mouth 2 (two) times daily., Disp: , Rfl:    Flaxseed, Linseed, (FLAXSEED OIL PO), Take by mouth., Disp: , Rfl:    metoprolol succinate (TOPROL-XL) 25 MG 24 hr tablet, TAKE 1 TABLET BY MOUTH IN THE MORNING, Disp: 90 tablet, Rfl: 3   Multiple Vitamins-Minerals (MULTIVITAMIN WITH MINERALS) tablet, Take 1 tablet by mouth., Disp: , Rfl:    Multiple Vitamins-Minerals (PRESERVISION AREDS) CAPS, Take 1 capsule by mouth daily., Disp: , Rfl:    vitamin E 180 MG (400 UNITS) capsule, Take 400 Units by mouth daily., Disp: , Rfl:    rosuvastatin (CRESTOR) 20 MG tablet, Take 1 tablet (20 mg total) by mouth at bedtime., Disp: 90 tablet, Rfl: 3  Consent:   NA   Disposition:   1 year  Patient may be asked to follow-up sooner based on the results of the above-mentioned testing.  Her questions and concerns were addressed to her satisfaction. She voices understanding of the recommendations provided during this encounter.    Signed, Tessa Lerner, DO, [facility]  [facility]  [address] [place], [place] [address] 05/04/2023 1:44 PM

## 2023-05-04 NOTE — Patient Instructions (Signed)

## 2023-06-24 ENCOUNTER — Other Ambulatory Visit: Payer: Self-pay | Admitting: Cardiology

## 2023-06-24 DIAGNOSIS — R002 Palpitations: Secondary | ICD-10-CM

## 2023-12-22 ENCOUNTER — Other Ambulatory Visit: Payer: Self-pay | Admitting: Cardiology

## 2023-12-22 DIAGNOSIS — E7800 Pure hypercholesterolemia, unspecified: Secondary | ICD-10-CM

## 2024-05-03 ENCOUNTER — Telehealth: Payer: Self-pay | Admitting: Cardiology

## 2024-05-03 NOTE — Telephone Encounter (Signed)
 Patient stated she is having a blood draw tomorrow (11/25) at her PCP Office (ph# [phone]) and wants to know if she can have the lab orders for blood work sent to her PCP office.  Patient wants a call back to confirm.

## 2024-05-04 NOTE — Telephone Encounter (Signed)
 Called and spoke to pt. Gave her Dr. Tyree rec's. Pt asked me to call her PCP's office to give them same info. I did call Jesus, FNP's office and they stated that a Lipid Panel and CMP was drawn today. They will fax results to us .

## 2024-05-04 NOTE — Telephone Encounter (Signed)
 I did not order an specific labs. But in the past we did discuss her lipids, she can have her PCP order a fasting lipid and CMP (if she is due for them).   Dr. Kiev Labrosse

## 2024-05-10 ENCOUNTER — Encounter: Payer: Self-pay | Admitting: Cardiology

## 2024-05-10 ENCOUNTER — Ambulatory Visit: Attending: Cardiology | Admitting: Cardiology

## 2024-05-10 VITALS — BP 158/80 | HR 71 | Resp 16 | Ht 67.0 in | Wt 125.2 lb

## 2024-05-10 DIAGNOSIS — I471 Supraventricular tachycardia, unspecified: Secondary | ICD-10-CM | POA: Diagnosis not present

## 2024-05-10 DIAGNOSIS — R002 Palpitations: Secondary | ICD-10-CM | POA: Diagnosis not present

## 2024-05-10 DIAGNOSIS — E7800 Pure hypercholesterolemia, unspecified: Secondary | ICD-10-CM | POA: Diagnosis not present

## 2024-05-10 DIAGNOSIS — I10 Essential (primary) hypertension: Secondary | ICD-10-CM

## 2024-05-10 MED ORDER — ROSUVASTATIN CALCIUM 20 MG PO TABS: 20.0000 mg | ORAL_TABLET | Freq: Every day | ORAL | 3 refills | Status: AC

## 2024-05-10 MED ORDER — METOPROLOL SUCCINATE ER 25 MG PO TB24: 25.0000 mg | ORAL_TABLET | Freq: Every morning | ORAL | 3 refills | Status: AC

## 2024-05-10 NOTE — Patient Instructions (Signed)
 Medication Instructions:  Medication refilled  *If you need a refill on your cardiac medications before your next appointment, please call your pharmacy*   Lab Work: Not needed If you have labs (blood work) drawn today and your tests are completely normal, you will receive your results only by: MyChart Message (if you have MyChart) OR A paper copy in the mail If you have any lab test that is abnormal or we need to change your treatment, we will call you to review the results.   Testing/Procedures: Not needed   Follow-Up: At [facility], you and your health needs are our priority.  As part of our continuing mission to provide you with exceptional heart care, we have created designated Provider Care Teams.  These Care Teams include your primary Cardiologist (physician) and Advanced Practice Providers (APPs -  Physician Assistants and Nurse Practitioners) who all work together to provide you with the care you need, when you need it.     Your next appointment:   12 month(s)  The format for your next appointment:   In Person  Provider:   Madonna Large, DO

## 2024-05-10 NOTE — Progress Notes (Signed)
 Cardiology Office Note:  .   Date:  05/10/2024  ID:  Sheryl Mejia, DOB June 27, 1945, MRN [id number] PCP:  Jesus Elberta Gainer, FNP  Former Cardiology Providers: Dr. Annalee Casa [facility] Providers Cardiologist:  Madonna Large, DO , [facility] (established care 11/2020) Electrophysiologist:  None  Click to update primary MD,subspecialty MD or APP then REFRESH:1}    Chief Complaint  Patient presents with   Follow-up   Hyperlipidemia    History of Present Illness: .   Sheryl Mejia is a 78 y.o. Caucasian female whose past medical history and cardiovascular risk factors includes: Hyperlipidemia, hypertension, anxiety, postmenopausal female.  Patient was referred to the practice for management of palpitations and hyperlipidemia.  Patient was referred to the practice for evaluation of palpitations and hyperlipidemia.  Hyperlipidemia: Initial LDL levels in the past were as high as 198 mg/dL.  With uptitration of medical therapy LDL levels improved.  In the past patient was concerned that Crestor  may lead her to glaucoma and she had brought newspaper articles.  At that time she did have a prior eye exam and was not noted to have findings of glaucoma.  Palpitations/PSVT. Her prior cardiac monitor noted episodes of PSVT.  She was started on Toprol -XL and since then she has remained asymptomatic.  In the past patient has mentioned that she has a degree of whitecoat hypertension as her blood pressures at the office are always elevated and at home they are usually between 120-130 mmHg.  She presents today for 1 year follow-up visit.  Over the last 1 year she denies anginal chest pain or heart failure symptoms. Home blood pressures are very well-controlled, see media section. Has not experienced palpitations on current dose of Toprol -XL. She has not appointment with her ophthalmologist in January 2026. She had labs with PCP on 05/06/2024 which were provided by the patient and reviewed as  part of today's visit.   Review of Systems: .   Review of Systems  Cardiovascular:  Negative for chest pain, claudication, irregular heartbeat, leg swelling, near-syncope, orthopnea, palpitations, paroxysmal nocturnal dyspnea and syncope.  Respiratory:  Negative for shortness of breath.   Hematologic/Lymphatic: Negative for bleeding problem.    Studies Reviewed:   EKG: EKG Interpretation Date/Time:  Tuesday May 10 2024 13:21:57 EST Text Interpretation: Normal sinus rhythm Septal infarct (cited on or before 04-May-2023) When compared with ECG of 04-May-2023 13:07, No significant change was found Confirmed by Large Madonna [phone]) on 05/10/2024 6:39:01 PM  Echocardiogram: 12/18/2020: Normal LV systolic function with EF 60%. Normal diastolic filling pattern. Mild tricuspid regurgitation. See report for additional details  Stress Testing: Exercise Myoview stress test 12/17/2020: 1 Day Rest/Stress Protocol.  Stress ECG negative for ischemia Normal myocardial perfusion without convincing evidence of reversible ischemia or prior infarct.  Low risk study See report for additional details  Cardiac monitor: 14 day extended Holter monitor: Dominant rhythm normal sinus rhythm. Heart rate 50-203 bpm.  Avg HR 81 bpm. No atrial fibrillation, NSVT, high grade AV block, pauses (3 seconds or longer). Total ventricular ectopic burden <1%. Total supraventricular ectopic burden <1%. Episodes of supraventricular and atrial tachycardia with the fastest interval lasting 7 beats at a max HR 203 bpm. Patient triggered events: 0.  RADIOLOGY: N/A  Risk Assessment/Calculations:   N/A   Labs:       Latest Ref Rng & Units 02/23/2012    1:14 PM 02/18/2012    4:00 PM  CBC  WBC 4.0 - 10.5 K/uL  4.5  Hemoglobin 12.0 - 15.0 g/dL 85.3  87.0   Hematocrit 36.0 - 46.0 %  36.9   Platelets 150 - 400 K/uL  217        Latest Ref Rng & Units 04/28/2023   10:53 AM 10/17/2022   11:06 AM 03/13/2022    11:12 AM  BMP  Glucose 70 - 99 mg/dL 91  94  894   BUN 8 - 27 mg/dL 20  18  20    Creatinine 0.57 - 1.00 mg/dL 9.19  9.18  9.17   BUN/Creat Ratio 12 - 28 25  22  24    Sodium 134 - 144 mmol/L 140  140  143   Potassium 3.5 - 5.2 mmol/L 4.2  4.4  4.9   Chloride 96 - 106 mmol/L 102  102  102   CO2 20 - 29 mmol/L 27  24  24    Calcium  8.7 - 10.3 mg/dL 9.3  9.2  9.7       Latest Ref Rng & Units 04/28/2023   10:53 AM 10/17/2022   11:06 AM 03/13/2022   11:12 AM  CMP  Glucose 70 - 99 mg/dL 91  94  894   BUN 8 - 27 mg/dL 20  18  20    Creatinine 0.57 - 1.00 mg/dL 9.19  9.18  9.17   Sodium 134 - 144 mmol/L 140  140  143   Potassium 3.5 - 5.2 mmol/L 4.2  4.4  4.9   Chloride 96 - 106 mmol/L 102  102  102   CO2 20 - 29 mmol/L 27  24  24    Calcium  8.7 - 10.3 mg/dL 9.3  9.2  9.7   Total Protein 6.0 - 8.5 g/dL 6.5  6.3  7.1   Total Bilirubin 0.0 - 1.2 mg/dL 0.5  0.6  0.6   Alkaline Phos 44 - 121 IU/L 74  73  79   AST 0 - 40 IU/L 20  21  22    ALT 0 - 32 IU/L 18  21  19      Lab Results  Component Value Date   CHOL 168 04/28/2023   HDL 83 04/28/2023   LDLCALC 74 04/28/2023   LDLDIRECT 79 10/17/2022   TRIG 57 04/28/2023   CHOLHDL 2.0 04/28/2023   No results for input(s): LIPOA in the last 8760 hours.  No components found for: NTPROBNP No results for input(s): PROBNP in the last 8760 hours. No results for input(s): TSH in the last 8760 hours.  External Labs: Collected: 04/28/2024 provided by the patient at today's visit. Sodium 141, potassium 4.1, chloride 104, bicarb 33, BUN 15, creatinine 0.82 Total cholesterol 160, triglycerides 63, HDL 86, LDL 60, non-HDL 74. Hemoglobin 13.2 g/dL  Physical Exam:    Today's Vitals   05/10/24 1319  BP: (!) 158/80  Pulse: 71  Resp: 16  SpO2: 98%  Weight: 125 lb 3.2 oz (56.8 kg)  Height: 5' 7 (1.702 m)   Body mass index is 19.61 kg/m. Wt Readings from Last 3 Encounters:  05/10/24 125 lb 3.2 oz (56.8 kg)  05/04/23 120 lb 9.6 oz (54.7  kg)  10/21/22 122 lb 3.2 oz (55.4 kg)    Physical Exam  Constitutional: No distress.  hemodynamically stable  Neck: No JVD present.  Cardiovascular: Normal rate, regular rhythm, S1 normal and S2 normal. Exam reveals no gallop, no S3 and no S4.  No murmur heard. Pulmonary/Chest: Effort normal and breath sounds normal. No stridor. She has no wheezes. She has no  rales.  Abdominal: Soft. Bowel sounds are normal. She exhibits no distension. There is no abdominal tenderness.  Musculoskeletal:        General: No edema.     Cervical back: Neck supple.  Neurological: She is alert and oriented to person, place, and time. She has intact cranial nerves (2-12).  Skin: Skin is warm.   Impression & Recommendation(s):  Impression:   ICD-10-CM   1. Benign hypertension  I10 EKG 12-Lead    2. Pure hypercholesterolemia  E78.00 rosuvastatin  (CRESTOR ) 20 MG tablet    3. Palpitations  R00.2 metoprolol  succinate (TOPROL -XL) 25 MG 24 hr tablet    4. SVT (supraventricular tachycardia)  I47.10        Recommendation(s):  Benign hypertension Office blood pressures are not at goal. Home blood pressures are very well-controlled, blood pressure log reviewed and uploaded under media section. Continue current medical therapy  Pure hypercholesterolemia Historically her LDL levels or as high as 198 mg/dL November 7975: LDL 74 mg/dL. November 2025 LDL 60 mg/dL Continue Crestor  20 mg p.o. nightly, refilled  Palpitations SVT (supraventricular tachycardia) Stable. Continue Toprol -XL 25 mg p.o. daily, medication refilled Appropriate cardiovascular workup in the past Still remains concerned and anxious about the current politics. Educated on the importance of stress/anxiety management as well.  Orders Placed:  Orders Placed This Encounter  Procedures   EKG 12-Lead    Final Medication List:    Meds ordered this encounter  Medications   rosuvastatin  (CRESTOR ) 20 MG tablet    Sig: Take 1 tablet (20  mg total) by mouth at bedtime.    Dispense:  90 tablet    Refill:  3   metoprolol  succinate (TOPROL -XL) 25 MG 24 hr tablet    Sig: Take 1 tablet (25 mg total) by mouth every morning.    Dispense:  90 tablet    Refill:  3    Medications Discontinued During This Encounter  Medication Reason   metoprolol  succinate (TOPROL -XL) 25 MG 24 hr tablet Reorder   rosuvastatin  (CRESTOR ) 20 MG tablet Reorder     Current Outpatient Medications:    Calcium  Citrate-Vitamin D (CALCIUM  CITRATE + D PO), Take by mouth., Disp: , Rfl:    cholecalciferol (VITAMIN D3) 25 MCG (1000 UNIT) tablet, Take 1,000 Units by mouth daily., Disp: , Rfl:    fish oil-omega-3 fatty acids 1000 MG capsule, Take 2 g by mouth 2 (two) times daily., Disp: , Rfl:    Flaxseed, Linseed, (FLAXSEED OIL PO), Take by mouth., Disp: , Rfl:    Multiple Vitamins-Minerals (MULTIVITAMIN WITH MINERALS) tablet, Take 1 tablet by mouth., Disp: , Rfl:    Multiple Vitamins-Minerals (PRESERVISION AREDS) CAPS, Take 1 capsule by mouth daily., Disp: , Rfl:    vitamin E 180 MG (400 UNITS) capsule, Take 400 Units by mouth daily., Disp: , Rfl:    metoprolol  succinate (TOPROL -XL) 25 MG 24 hr tablet, Take 1 tablet (25 mg total) by mouth every morning., Disp: 90 tablet, Rfl: 3   rosuvastatin  (CRESTOR ) 20 MG tablet, Take 1 tablet (20 mg total) by mouth at bedtime., Disp: 90 tablet, Rfl: 3  Consent:   NA   Disposition:   1 year follow-up sooner if needed  Her questions and concerns were addressed to her satisfaction. She voices understanding of the recommendations provided during this encounter.    Signed, Madonna Michele HAS, [facility]  A Division of [facility] [address] [place],  [address]
